# Patient Record
Sex: Female | Born: 1995
Health system: Southern US, Community
[De-identification: ages and names within clinical notes are randomized; demographics above are authoritative.]

## PROBLEM LIST (undated history)

## (undated) DIAGNOSIS — G43909 Migraine, unspecified, not intractable, without status migrainosus: Secondary | ICD-10-CM

## (undated) DIAGNOSIS — G40909 Epilepsy, unspecified, not intractable, without status epilepticus: Secondary | ICD-10-CM

## (undated) HISTORY — PX: WISDOM TOOTH EXTRACTION: SHX21

## (undated) HISTORY — PX: TONSILLECTOMY: SUR1361

---
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
## 2014-11-25 DIAGNOSIS — R55 Syncope and collapse: Secondary | ICD-10-CM | POA: Insufficient documentation

## 2015-02-22 NOTE — H&P (Signed)
PATIENT NAME:  Taylor Esparza, Taylor Esparza MR#:  [id number] DATE OF BIRTH:  05-22-1996  DATE OF ADMISSION:  06/05/2012  REFERRING PHYSICIAN: [facility], Liane Comber, NP PRIMARY CARE PHYSICIAN: Dr. Sullivan Lone   CHIEF COMPLAINT: Sore throat, fever, orthostasis, syncope x2.   HISTORY OF PRESENT ILLNESS: Patient is a 19 year old female with past medical history of migraine headaches, seasonal allergies and vasovagal syncope, patient's father also vasovagal syncope who started feeling faint and not well about five days ago. Subsequently she developed sore throat and fatigue. Today she went to see her PCP because of worsening sore throat, headache, body aches and feeling that she was going to pass out. In the PCPs office she was tested for strep and was found to be positive. She was orthostatic with drop in blood pressure and tachycardia just on sitting up from a laying down position. She actually passed out twice when they attempted to check orthostatics on her in the PCPs office therefore admission was requested.   ALLERGIES: No known allergies.   MEDICATIONS: None.   PAST MEDICAL HISTORY: 1. Seasonal allergies.  2. Vasovagal syncope. 3. Migraine headaches.   PAST SURGICAL HISTORY:  1. Tonsillectomy as a child. 2. Bead got stuck in the patient's nose which had to be surgically removed.    FAMILY HISTORY: Father has vasovagal syncope. Mother has migraine headaches, Meniere's disease.   SOCIAL HISTORY: There is no history of smoking, alcohol or drug abuse. Patient lives with her parents. She is scheduled to start school/college shortly.    REVIEW OF SYSTEMS: CONSTITUTIONAL: Reports fever, fatigue, weakness. EYES: Denies any blurred or double vision. ENT: Denies any tinnitus, ear pain. Reports sore throat. RESPIRATORY: Denies any wheezing, shortness of breath. CARDIOVASCULAR: Reports palpitations and syncope. GASTROINTESTINAL: Denies any nausea, vomiting, diarrhea, abdominal pain.  GENITOURINARY: Denies any dysuria, hematuria. ENDO: Denies any polyuria or nocturia. HEME/LYMPH: Denies any anemia, easy bruisability. MUSCULOSKELETAL: Denies any swelling, gout. NEUROLOGICAL: Denies any numbness, weakness. PSYCH: Denies any anxiety, depression. INTEGUMENT: Denies acne or rash.   PHYSICAL EXAMINATION:  VITAL SIGNS: Temperature 98.7, heart rate 82, respiratory rate 16, blood pressure 100/67, pulse oximetry 97% on room air.   GENERAL: Patient is a young Caucasian female lying comfortably in bed, not in acute distress.   HEAD: Atraumatic, normocephalic.   EYES: No pallor, icterus, or cyanosis. Pupils are equal, round and reactive to light and accommodation. Extraocular movements intact.   ENT: Wet mucous membranes. No oropharyngeal or thrush. Patient is status post tonsillectomy and there is erythema in her oropharynx.   NECK: Supple. No masses. No JVD. No thyromegaly.   CHEST WALL: No tenderness to palpation. Not using accessory muscles of respiration. No intracostal muscle retractions.   LUNGS: Bilaterally clear to auscultation. No wheezing, rales, rhonchi regular.   CARDIOVASCULAR: S1, S2 regular. No murmur, rubs, or gallops.   ABDOMEN: Soft, nontender, nondistended. No guarding. No rigidity. No organomegaly. Normal bowel sounds.   SKIN: No rashes or lesions.   PERIPHERIES: No pedal edema. 2+ pedal pulses.   MUSCULOSKELETAL: No cyanosis or clubbing.   NEUROLOGIC: Awake, alert, oriented x3. Nonfocal neurological exam. Cranial nerves grossly intact.   PSYCH: Normal mood and affect.   LABORATORY, DIAGNOSTIC, AND RADIOLOGICAL DATA: Labs have been ordered and are currently pending.   ASSESSMENT AND PLAN: 19 year old female with history of vasovagal syncope, migraines, seasonal allergies sent from PCPs office as a direct admit for fevers, strep throat, orthostasis and syncope x2. 1. Strep throat with fever. Will admit the patient has  observation. Start her on IV fluids,  IV antibiotics. Will obtain blood cultures since patient has had a fever of 101.3 at home. Will also check a chest x-ray, urinalysis, and pregnancy test. Will obtain CBC, complete metabolic panel. Will place on mechanical soft diet since patient reports sore throat and also Cepacol lozenges.   2. Orthostasis, possibly because of dehydration and poor oral intake resulting from strep throat/sore throat. Will hydrate with IV fluids. Monitor orthostasis.  3. Syncope x2 in the PCPs office. Patient reports that every time she sits up she feels faint. The syncope could be related to orthostasis. Patient also has history of vasovagal syncope. Will monitor patient closely.  Discussed with patient's primary care physician, discussed with the patient and her parents the plan of care and management.   TIME SPENT: 75 minutes.   ____________________________ Darrick MeigsSangeeta Mason Burleigh, MD sp:cms D: 06/05/2012 13:39:28 ET T: 06/05/2012 14:04:04 ET JOB#: [id number]  cc: Darrick MeigsSangeeta Jams Trickett, MD, <Dictator> Richard L. Sullivan LoneGilbert, MD Darrick MeigsSANGEETA Maelys Kinnick MD ELECTRONICALLY SIGNED 06/05/2012 16:19

## 2015-02-22 NOTE — Discharge Summary (Signed)
PATIENT NAME:  Taylor Esparza, Taylor Esparza MR#:  [id number] DATE OF BIRTH:  05-21-1996  DATE OF ADMISSION:  06/05/2012 DATE OF DISCHARGE:  06/07/2012  ADMISSION DIAGNOSES:  1. Strep pharyngitis.  2. Syncope.   DISCHARGE DIAGNOSES:  1. Syncope likely due to infection or vasovagal.  2. Cervical lymphadenopathy with positive strep throat culture and pharyngitis.   CONSULTS: None.   LABORATORY, DIAGNOSTIC AND RADIOLOGICAL DATA: White blood cells 15, hemoglobin 12, hematocrit 35, platelets 235. Blood cultures negative to date.   Mono test is negative.   2-D echocardiogram showed normal ejection fraction, no valvular abnormalities.   HOSPITAL COURSE: 19 year old female was sent from PCPs office with syncope and strep pharyngitis. For further details, please refer to the history and physical. 1. Vasovagal syncope likely due to infection. Echo was normal. No evidence of HOCM.Marland Kitchen. Patient may need a Holter and outpatient follow up ad also to rule out adrenal insufficiency given her history of syncopal episodes in the past.  2. Cervical lymphadenopathy with positive strep throat culture and pharyngitis. Patient was on IV Unasyn. Will change to p.o. amoxicillin at discharge. She has history of multiple sinus and respiratory issues. Her PCP may consider an immunologist follow up.   DISCHARGE MEDICATIONS:  1. Famciclovir 500 mg every 12 hours for herpes type I.  2. Amoxicillin 875 b.i.d. for eight days.   DISCHARGE DIET: Regular.   DISCHARGE ACTIVITY: As tolerated.   DISCHARGE FOLLOW UP: Follow up with Liane Combereborah Brady, NP in one week.    TIME SPENT: 35 minutes.  ____________________________ Janyth ContesSital P. Juliene PinaMody, MD spm:cms D: 06/07/2012 14:38:58 ET Esparza: 06/07/2012 16:23:13 ET JOB#: [id number]  cc: Yalexa Blust P. Juliene PinaMody, MD, <Dictator> Alphonzo Severanceeborah D. Brady, NP Janyth ContesSITAL P Dreden Rivere MD ELECTRONICALLY SIGNED 06/08/2012 22:05

## 2015-12-22 LAB — HM PAP SMEAR: HM Pap smear: NEGATIVE

## 2016-02-21 ENCOUNTER — Encounter: Payer: Self-pay | Admitting: Family Medicine

## 2016-03-09 DIAGNOSIS — I4510 Unspecified right bundle-branch block: Secondary | ICD-10-CM | POA: Diagnosis not present

## 2016-03-09 DIAGNOSIS — Z79899 Other long term (current) drug therapy: Secondary | ICD-10-CM | POA: Diagnosis not present

## 2016-03-09 DIAGNOSIS — I456 Pre-excitation syndrome: Secondary | ICD-10-CM | POA: Diagnosis not present

## 2016-03-09 DIAGNOSIS — Z87891 Personal history of nicotine dependence: Secondary | ICD-10-CM | POA: Diagnosis not present

## 2016-03-09 DIAGNOSIS — I442 Atrioventricular block, complete: Secondary | ICD-10-CM | POA: Diagnosis not present

## 2016-03-09 DIAGNOSIS — R569 Unspecified convulsions: Secondary | ICD-10-CM | POA: Diagnosis not present

## 2016-03-09 DIAGNOSIS — Z5181 Encounter for therapeutic drug level monitoring: Secondary | ICD-10-CM | POA: Diagnosis not present

## 2016-03-09 DIAGNOSIS — R55 Syncope and collapse: Secondary | ICD-10-CM | POA: Diagnosis not present

## 2016-05-17 DIAGNOSIS — R55 Syncope and collapse: Secondary | ICD-10-CM | POA: Diagnosis not present

## 2016-05-17 DIAGNOSIS — R569 Unspecified convulsions: Secondary | ICD-10-CM | POA: Diagnosis not present

## 2016-05-21 DIAGNOSIS — F4323 Adjustment disorder with mixed anxiety and depressed mood: Secondary | ICD-10-CM | POA: Diagnosis not present

## 2016-05-28 DIAGNOSIS — F4323 Adjustment disorder with mixed anxiety and depressed mood: Secondary | ICD-10-CM | POA: Diagnosis not present

## 2016-05-28 DIAGNOSIS — B360 Pityriasis versicolor: Secondary | ICD-10-CM | POA: Diagnosis not present

## 2016-05-28 DIAGNOSIS — L819 Disorder of pigmentation, unspecified: Secondary | ICD-10-CM | POA: Diagnosis not present

## 2016-06-04 DIAGNOSIS — F4323 Adjustment disorder with mixed anxiety and depressed mood: Secondary | ICD-10-CM | POA: Diagnosis not present

## 2016-06-12 DIAGNOSIS — F4323 Adjustment disorder with mixed anxiety and depressed mood: Secondary | ICD-10-CM | POA: Diagnosis not present

## 2016-06-27 DIAGNOSIS — F4323 Adjustment disorder with mixed anxiety and depressed mood: Secondary | ICD-10-CM | POA: Diagnosis not present

## 2016-07-02 DIAGNOSIS — F4323 Adjustment disorder with mixed anxiety and depressed mood: Secondary | ICD-10-CM | POA: Diagnosis not present

## 2016-07-09 DIAGNOSIS — F4323 Adjustment disorder with mixed anxiety and depressed mood: Secondary | ICD-10-CM | POA: Diagnosis not present

## 2016-07-16 DIAGNOSIS — F4323 Adjustment disorder with mixed anxiety and depressed mood: Secondary | ICD-10-CM | POA: Diagnosis not present

## 2016-07-23 DIAGNOSIS — F4323 Adjustment disorder with mixed anxiety and depressed mood: Secondary | ICD-10-CM | POA: Diagnosis not present

## 2016-08-19 DIAGNOSIS — S93601A Unspecified sprain of right foot, initial encounter: Secondary | ICD-10-CM | POA: Diagnosis not present

## 2016-08-19 DIAGNOSIS — X58XXXA Exposure to other specified factors, initial encounter: Secondary | ICD-10-CM | POA: Diagnosis not present

## 2016-08-19 DIAGNOSIS — S99921A Unspecified injury of right foot, initial encounter: Secondary | ICD-10-CM | POA: Diagnosis not present

## 2016-08-19 DIAGNOSIS — M7989 Other specified soft tissue disorders: Secondary | ICD-10-CM | POA: Diagnosis not present

## 2016-08-19 DIAGNOSIS — S20412A Abrasion of left back wall of thorax, initial encounter: Secondary | ICD-10-CM | POA: Diagnosis not present

## 2016-08-28 ENCOUNTER — Ambulatory Visit: Payer: Self-pay | Admitting: Family Medicine

## 2016-10-01 DIAGNOSIS — B9789 Other viral agents as the cause of diseases classified elsewhere: Secondary | ICD-10-CM | POA: Diagnosis not present

## 2016-10-01 DIAGNOSIS — H9209 Otalgia, unspecified ear: Secondary | ICD-10-CM | POA: Diagnosis not present

## 2016-10-01 DIAGNOSIS — J069 Acute upper respiratory infection, unspecified: Secondary | ICD-10-CM | POA: Diagnosis not present

## 2016-10-18 DIAGNOSIS — Z01419 Encounter for gynecological examination (general) (routine) without abnormal findings: Secondary | ICD-10-CM | POA: Diagnosis not present

## 2016-10-18 DIAGNOSIS — Z113 Encounter for screening for infections with a predominantly sexual mode of transmission: Secondary | ICD-10-CM | POA: Diagnosis not present

## 2016-10-18 DIAGNOSIS — L282 Other prurigo: Secondary | ICD-10-CM | POA: Diagnosis not present

## 2016-10-18 DIAGNOSIS — Z6821 Body mass index (BMI) 21.0-21.9, adult: Secondary | ICD-10-CM | POA: Diagnosis not present

## 2016-11-20 ENCOUNTER — Encounter: Payer: Self-pay | Admitting: Family Medicine

## 2016-11-20 ENCOUNTER — Ambulatory Visit (INDEPENDENT_AMBULATORY_CARE_PROVIDER_SITE_OTHER): Payer: BLUE CROSS/BLUE SHIELD | Admitting: Family Medicine

## 2016-11-20 VITALS — BP 124/76 | HR 100 | Temp 98.7°F | Resp 12 | Ht 66.25 in | Wt 141.0 lb

## 2016-11-20 DIAGNOSIS — F329 Major depressive disorder, single episode, unspecified: Secondary | ICD-10-CM | POA: Insufficient documentation

## 2016-11-20 DIAGNOSIS — F411 Generalized anxiety disorder: Secondary | ICD-10-CM | POA: Diagnosis not present

## 2016-11-20 DIAGNOSIS — R6889 Other general symptoms and signs: Secondary | ICD-10-CM

## 2016-11-20 DIAGNOSIS — F3289 Other specified depressive episodes: Secondary | ICD-10-CM | POA: Diagnosis not present

## 2016-11-20 DIAGNOSIS — F32A Depression, unspecified: Secondary | ICD-10-CM | POA: Insufficient documentation

## 2016-11-20 MED ORDER — CITALOPRAM HYDROBROMIDE 10 MG PO TABS: 10.0000 mg | ORAL_TABLET | Freq: Every day | ORAL | 5 refills | Status: DC

## 2016-11-20 MED ORDER — OSELTAMIVIR PHOSPHATE 75 MG PO CAPS: 75.0000 mg | ORAL_CAPSULE | Freq: Two times a day (BID) | ORAL | 0 refills | Status: DC

## 2016-11-20 NOTE — Progress Notes (Signed)
Taylor Esparza  MRN: [id number] DOB: [birth year]  Subjective:  HPI  Patient is here to discuss 2 issues.  First, she has felt bad since Sunday 11/18/16. Symptoms are sore throat, ear pain, nasal/head congestion, headache, swollen glands, body aches, feeling feverish. Her mother had flu and father got sick and they took Tamiflu. They had some at home and patient has been taking Tamiflu since yesterday.   Second is anxiety and depression has gotten worse. This has been an issue for a few months but worse in the last 2 to 3 weeks. She use to be on Sertraline in the past for short period of time and that was started by gynecologist she thinks and then she saw psychiatrist one time and was switched to Effexor and tried that medication for about 6 months to 1 year. She then stopped just did not feel balanced-felt flat. She has been seen therapist/counselor and has appointment with new psychiatrist on Friday 11/23/16. She admits to having occasional suicidal thoughts "like just don't want to be here" per patient. Has not attempted to hurt yourself and does not have a plan of hurting or killing herself. Overall she has had trouble with depression/anxiety since 2012 around that. Alcohol use about 1 to 3 beers at dinner daily. Denies drug use or tobacco use.  She does states that she broke up with her boyfriend in august 2017 officially after 2 years of been together.  Depression screen PHQ 2/9 11/20/2016  Decreased Interest 1  Down, Depressed, Hopeless 2  PHQ - 2 Score 3  Altered sleeping 1  Tired, decreased energy 1  Change in appetite 1  Feeling bad or failure about yourself  2  Trouble concentrating 0  Moving slowly or fidgety/restless 0  Suicidal thoughts 1  PHQ-9 Score 9  Difficult doing work/chores Somewhat difficult   Patient Active Problem List   Diagnosis Date Noted  . Anxiety, generalized 11/20/2016  . Clinical depression 11/20/2016  . Vasovagal syncope 11/25/2014    History  reviewed. No pertinent past medical history.  Social History   Social History  . Marital status: Single    Spouse name: N/A  . Number of children: N/A  . Years of education: N/A   Occupational History  . Not on file.   Social History Main Topics  . Smoking status: Never Smoker  . Smokeless tobacco: Never Used  . Alcohol use Yes     Comment: 1 beer at dinner usually  . Drug use: No  . Sexual activity: Not on file   Other Topics Concern  . Not on file   Social History Narrative  . No narrative on file    Outpatient Encounter Prescriptions as of 11/20/2016  Medication Sig Note  . SPRINTEC 28 0.25-35 MG-MCG tablet Take 1 tablet by mouth daily. 11/20/2016: Received from: External Pharmacy Received Sig: TAKE 1 TABLET EVERY DAY  . [DISCONTINUED] venlafaxine XR (EFFEXOR XR) 75 MG 24 hr capsule Take 1 capsule by mouth. 11/20/2016: Prescribed by Dr. Maryruth Bun (psychiatry) Received from: [facility] Received Sig: EFFEXOR XR, 75MG  (Oral Capsule Extended Release 24 Hour) - Historical Medication  1 daily (75 MG) Active Comments: Prescribed by Dr. Maryruth Bun (psychiatry   No facility-administered encounter medications on file as of 11/20/2016.     No Known Allergies  Review of Systems  Constitutional: Positive for chills, fever and malaise/fatigue.  HENT: Positive for congestion, ear pain and sore throat.   Respiratory: Negative.   Cardiovascular: Negative.   Musculoskeletal:  Negative.   Psychiatric/Behavioral: Positive for depression. The patient has insomnia.        Occasional suicidal thoughts.    Objective:  BP 124/76   Pulse 100   Temp 98.7 F (37.1 C)   Resp 12   Ht 5' 6.25" (1.683 m)   Wt 141 lb (64 kg)   LMP 10/27/2016   SpO2 98%   BMI 22.59 kg/m   Physical Exam  Constitutional: She is oriented to person, place, and time and well-developed, well-nourished, and in no distress.  HENT:  Head: Normocephalic and atraumatic.  Right Ear: External ear normal.    Left Ear: External ear normal.  Mouth/Throat: Oropharynx is clear and moist.  Eyes: Conjunctivae are normal. Pupils are equal, round, and reactive to light.  Neck: Normal range of motion. Neck supple.  Cardiovascular: Normal rate, regular rhythm, normal heart sounds and intact distal pulses.   No murmur heard. Pulmonary/Chest: Effort normal and breath sounds normal. No respiratory distress. She has no wheezes.  Neurological: She is alert and oriented to person, place, and time.    Assessment and Plan :  1. Anxiety, generalized 2. Other depression Worsening. Advised patient to keep appointment with psychiatrist. Continue counseling. Start Citalopram. Follow up with psychiatrist on this issue unless psychiatrist says to follow up with us instead.I do not think she is bipolar. 3. Flu-like symptoms Both parents had flu. I think she has flu. She has been on Tamiflu since yesterday 11/19/16, will extend treatment with Tamiflu at twice daily.  HPI, Exam and A&P transcribed under direction and in the presence of Julieanne Mansonichard Tyray Proch, MD. I have done the exam and reviewed the chart and it is accurate to the best of my knowledge. [employer]  technology has been used and  any errors in dictation or transcription are unintentional. Julieanne Mansonichard Sharone Almond M.D. [facility]

## 2016-11-21 LAB — TSH: TSH: 1.92 u[IU]/mL (ref 0.450–4.500)

## 2016-11-23 DIAGNOSIS — F411 Generalized anxiety disorder: Secondary | ICD-10-CM | POA: Diagnosis not present

## 2016-11-23 DIAGNOSIS — F331 Major depressive disorder, recurrent, moderate: Secondary | ICD-10-CM | POA: Diagnosis not present

## 2016-11-23 DIAGNOSIS — F603 Borderline personality disorder: Secondary | ICD-10-CM | POA: Diagnosis not present

## 2016-12-07 DIAGNOSIS — F331 Major depressive disorder, recurrent, moderate: Secondary | ICD-10-CM | POA: Diagnosis not present

## 2016-12-28 DIAGNOSIS — F331 Major depressive disorder, recurrent, moderate: Secondary | ICD-10-CM | POA: Diagnosis not present

## 2017-01-18 DIAGNOSIS — F331 Major depressive disorder, recurrent, moderate: Secondary | ICD-10-CM | POA: Diagnosis not present

## 2017-02-04 ENCOUNTER — Other Ambulatory Visit: Payer: Self-pay

## 2017-02-04 ENCOUNTER — Telehealth: Payer: Self-pay | Admitting: Family Medicine

## 2017-02-04 MED ORDER — CITALOPRAM HYDROBROMIDE 10 MG PO TABS: 10.0000 mg | ORAL_TABLET | Freq: Every day | ORAL | 1 refills | Status: DC

## 2017-02-04 NOTE — Telephone Encounter (Signed)
Rx sent to pharmacy  ED 

## 2017-02-04 NOTE — Telephone Encounter (Signed)
[facility] faxed a refill request for a 90-day supply of the following medication. Thanks   Citalopram HBR  Take 1 tablet (10 mg Total) by mouth Daily.

## 2017-02-06 DIAGNOSIS — F4323 Adjustment disorder with mixed anxiety and depressed mood: Secondary | ICD-10-CM | POA: Diagnosis not present

## 2017-02-13 DIAGNOSIS — F4323 Adjustment disorder with mixed anxiety and depressed mood: Secondary | ICD-10-CM | POA: Diagnosis not present

## 2017-03-05 DIAGNOSIS — F4323 Adjustment disorder with mixed anxiety and depressed mood: Secondary | ICD-10-CM | POA: Diagnosis not present

## 2017-03-12 DIAGNOSIS — F4323 Adjustment disorder with mixed anxiety and depressed mood: Secondary | ICD-10-CM | POA: Diagnosis not present

## 2017-03-26 DIAGNOSIS — F4323 Adjustment disorder with mixed anxiety and depressed mood: Secondary | ICD-10-CM | POA: Diagnosis not present

## 2017-04-02 DIAGNOSIS — F4323 Adjustment disorder with mixed anxiety and depressed mood: Secondary | ICD-10-CM | POA: Diagnosis not present

## 2017-04-23 DIAGNOSIS — F4323 Adjustment disorder with mixed anxiety and depressed mood: Secondary | ICD-10-CM | POA: Diagnosis not present

## 2017-04-30 DIAGNOSIS — F4323 Adjustment disorder with mixed anxiety and depressed mood: Secondary | ICD-10-CM | POA: Diagnosis not present

## 2017-05-21 DIAGNOSIS — F4323 Adjustment disorder with mixed anxiety and depressed mood: Secondary | ICD-10-CM | POA: Diagnosis not present

## 2017-06-04 DIAGNOSIS — F4323 Adjustment disorder with mixed anxiety and depressed mood: Secondary | ICD-10-CM | POA: Diagnosis not present

## 2017-06-11 DIAGNOSIS — F4323 Adjustment disorder with mixed anxiety and depressed mood: Secondary | ICD-10-CM | POA: Diagnosis not present

## 2017-06-25 DIAGNOSIS — F4323 Adjustment disorder with mixed anxiety and depressed mood: Secondary | ICD-10-CM | POA: Diagnosis not present

## 2017-07-23 DIAGNOSIS — F4323 Adjustment disorder with mixed anxiety and depressed mood: Secondary | ICD-10-CM | POA: Diagnosis not present

## 2017-07-30 DIAGNOSIS — F4323 Adjustment disorder with mixed anxiety and depressed mood: Secondary | ICD-10-CM | POA: Diagnosis not present

## 2017-08-06 DIAGNOSIS — F4323 Adjustment disorder with mixed anxiety and depressed mood: Secondary | ICD-10-CM | POA: Diagnosis not present

## 2017-08-13 DIAGNOSIS — F4323 Adjustment disorder with mixed anxiety and depressed mood: Secondary | ICD-10-CM | POA: Diagnosis not present

## 2017-09-03 DIAGNOSIS — F4323 Adjustment disorder with mixed anxiety and depressed mood: Secondary | ICD-10-CM | POA: Diagnosis not present

## 2017-09-17 DIAGNOSIS — F4323 Adjustment disorder with mixed anxiety and depressed mood: Secondary | ICD-10-CM | POA: Diagnosis not present

## 2017-09-28 DIAGNOSIS — J209 Acute bronchitis, unspecified: Secondary | ICD-10-CM | POA: Diagnosis not present

## 2018-02-10 DIAGNOSIS — J111 Influenza due to unidentified influenza virus with other respiratory manifestations: Secondary | ICD-10-CM | POA: Diagnosis not present

## 2018-03-27 DIAGNOSIS — H52221 Regular astigmatism, right eye: Secondary | ICD-10-CM | POA: Diagnosis not present

## 2018-04-03 ENCOUNTER — Ambulatory Visit: Payer: BLUE CROSS/BLUE SHIELD | Admitting: Family Medicine

## 2018-04-09 ENCOUNTER — Ambulatory Visit (INDEPENDENT_AMBULATORY_CARE_PROVIDER_SITE_OTHER): Payer: BLUE CROSS/BLUE SHIELD | Admitting: Family Medicine

## 2018-04-09 ENCOUNTER — Encounter: Payer: Self-pay | Admitting: Family Medicine

## 2018-04-09 VITALS — BP 124/78 | HR 64 | Temp 98.6°F | Resp 16 | Wt 150.0 lb

## 2018-04-09 DIAGNOSIS — F411 Generalized anxiety disorder: Secondary | ICD-10-CM | POA: Diagnosis not present

## 2018-04-09 DIAGNOSIS — G43809 Other migraine, not intractable, without status migrainosus: Secondary | ICD-10-CM

## 2018-04-09 MED ORDER — PROPRANOLOL HCL 20 MG PO TABS: 20.0000 mg | ORAL_TABLET | ORAL | 11 refills | Status: DC

## 2018-04-09 MED ORDER — SUMATRIPTAN SUCCINATE 50 MG PO TABS: 50.0000 mg | ORAL_TABLET | ORAL | 0 refills | Status: DC | PRN

## 2018-04-09 MED ORDER — PAROXETINE HCL 10 MG PO TABS: 10.0000 mg | ORAL_TABLET | Freq: Every day | ORAL | 11 refills | Status: DC

## 2018-04-09 NOTE — Progress Notes (Signed)
       Patient: Taylor ButteryChandler T Axton Female    DOB: 1996/07/19   22 y.o.   MRN: [id number] Visit Date: 04/09/2018  Today's Provider: Megan Mansichard Latasha Puskas Jr, MD   Chief Complaint  Patient presents with  . Headache  . Anxiety   Subjective:    HPI PT is here today to discuss her headaches and anxiety. She has been on migraine medication in the past and anxiety medication. She would like to know if she needs to see a specialist for this. She reports that she just stared a new job and she has already been out 2 and half days due to migraines.   She did try topamax and Imitrex but only remembers side effects of topamax.  No Known Allergies   Current Outpatient Medications:  .  citalopram (CELEXA) 10 MG tablet, Take 1 tablet (10 mg total) by mouth daily. (Patient not taking: Reported on 04/09/2018), Disp: 90 tablet, Rfl: 1 .  SPRINTEC 28 0.25-35 MG-MCG tablet, Take 1 tablet by mouth daily., Disp: , Rfl: 3  Review of Systems  Constitutional: Negative.   HENT: Negative.   Eyes: Negative.   Respiratory: Negative.   Cardiovascular: Negative.   Gastrointestinal: Negative.   Endocrine: Negative.   Musculoskeletal: Negative.   Skin: Negative.   Allergic/Immunologic: Negative.   Neurological: Positive for headaches.  Hematological: Negative.   Psychiatric/Behavioral: The patient is nervous/anxious.     Social History   Tobacco Use  . Smoking status: Never Smoker  . Smokeless tobacco: Never Used  Substance Use Topics  . Alcohol use: Yes    Comment: 1 beer at dinner usually   Objective:   BP 124/78 (BP Location: Left Arm, Patient Position: Sitting, Cuff Size: Normal)   Pulse 64   Temp 98.6 F (37 C) (Oral)   Resp 16   Wt 150 lb (68 kg)   BMI 24.03 kg/m  Vitals:   04/09/18 1626  BP: 124/78  Pulse: 64  Resp: 16  Temp: 98.6 F (37 C)  TempSrc: Oral  Weight: 150 lb (68 kg)     Physical Exam  Constitutional: She is oriented to person, place, and time. She appears  well-developed and well-nourished.  Eyes: Pupils are equal, round, and reactive to light. No scleral icterus.  Cardiovascular: Normal rate, regular rhythm and normal heart sounds.  Pulmonary/Chest: Effort normal and breath sounds normal.  Abdominal: Soft.  Neurological: She is alert and oriented to person, place, and time. She has normal strength.  Skin: Skin is warm and dry.  Psychiatric: She has a normal mood and affect. Her behavior is normal.        Assessment & Plan:     1. Anxiety, generalized  - propranolol (INDERAL) 20 MG tablet; Take 1 tablet (20 mg total) by mouth every morning.  Dispense: 30 tablet; Refill: 11 - SUMAtriptan (IMITREX) 50 MG tablet; Take 1 tablet (50 mg total) by mouth every 2 (two) hours as needed for migraine. May repeat in 2 hours if headache persists or recurs.  Dispense: 10 tablet; Refill: 0  2. Other migraine without status migrainosus, not intractable RTC 1 month. - PARoxetine (PAXIL) 10 MG tablet; Take 1 tablet (10 mg total) by mouth at bedtime.  Dispense: 30 tablet; Refill: 1       Nettie Wyffels Wendelyn BreslowGilbert Jr, MD  [facility]

## 2018-05-14 ENCOUNTER — Ambulatory Visit (INDEPENDENT_AMBULATORY_CARE_PROVIDER_SITE_OTHER): Payer: BLUE CROSS/BLUE SHIELD | Admitting: Family Medicine

## 2018-05-14 ENCOUNTER — Encounter: Payer: Self-pay | Admitting: Family Medicine

## 2018-05-14 VITALS — BP 122/70 | HR 90 | Temp 99.1°F | Ht 66.0 in | Wt 151.0 lb

## 2018-05-14 DIAGNOSIS — R5383 Other fatigue: Secondary | ICD-10-CM | POA: Diagnosis not present

## 2018-05-14 DIAGNOSIS — F411 Generalized anxiety disorder: Secondary | ICD-10-CM

## 2018-05-14 DIAGNOSIS — G43809 Other migraine, not intractable, without status migrainosus: Secondary | ICD-10-CM

## 2018-05-14 MED ORDER — DESVENLAFAXINE ER 50 MG PO TB24: 50.0000 mg | ORAL_TABLET | Freq: Every day | ORAL | 3 refills | Status: DC

## 2018-05-14 NOTE — Progress Notes (Signed)
Patient: Taylor ButteryChandler T Esparza Female    DOB: 1996-01-10   22 y.o.   MRN: [id number] Visit Date: 05/14/2018  Today's Provider: Megan Mansichard Larnell Granlund Jr, MD   Chief Complaint  Patient presents with  . Anxiety  . Migraine   Subjective:    HPI Patient comes in today for a follow up. She was seen in the office 1 month ago.     She reports that her migraines have improved. However, she does not feel the Paxil has helped. She reports that she has hot flashes and "waves" of nausea that come and go.     No Known Allergies   Current Outpatient Medications:  .  PARoxetine (PAXIL) 10 MG tablet, Take 1 tablet (10 mg total) by mouth at bedtime., Disp: 30 tablet, Rfl: 11 .  propranolol (INDERAL) 20 MG tablet, Take 1 tablet (20 mg total) by mouth every morning., Disp: 30 tablet, Rfl: 11 .  SUMAtriptan (IMITREX) 50 MG tablet, Take 1 tablet (50 mg total) by mouth every 2 (two) hours as needed for migraine. May repeat in 2 hours if headache persists or recurs., Disp: 10 tablet, Rfl: 0 .  citalopram (CELEXA) 10 MG tablet, Take 1 tablet (10 mg total) by mouth daily. (Patient not taking: Reported on 04/09/2018), Disp: 90 tablet, Rfl: 1 .  SPRINTEC 28 0.25-35 MG-MCG tablet, Take 1 tablet by mouth daily., Disp: , Rfl: 3  Review of Systems  Constitutional: Positive for fatigue. Negative for activity change, appetite change, chills, diaphoresis, fever and unexpected weight change.  Cardiovascular: Negative for chest pain and palpitations.  Gastrointestinal: Positive for nausea. Negative for abdominal distention, abdominal pain, diarrhea and vomiting.  Skin: Negative.   Neurological: Negative for dizziness, tremors, weakness, light-headedness and headaches.  Psychiatric/Behavioral: Positive for sleep disturbance. Negative for agitation, behavioral problems, confusion, decreased concentration, self-injury and suicidal ideas. The patient is nervous/anxious.     Social History   Tobacco Use  . Smoking  status: Never Smoker  . Smokeless tobacco: Never Used  Substance Use Topics  . Alcohol use: Yes    Comment: 1 beer at dinner usually   Objective:   BP 122/70 (BP Location: Right Arm, Patient Position: Sitting, Cuff Size: Normal)   Pulse 90   Temp 99.1 F (37.3 C)   Ht 5\' 6"  (1.676 m)   Wt 151 lb (68.5 kg)   LMP 04/19/2018   SpO2 96%   BMI 24.37 kg/m  Vitals:   05/14/18 1617  BP: 122/70  Pulse: 90  Temp: 99.1 F (37.3 C)  SpO2: 96%  Weight: 151 lb (68.5 kg)  Height: 5\' 6"  (1.676 m)     Physical Exam  Constitutional: She is oriented to person, place, and time. She appears well-developed and well-nourished.  HENT:  Head: Normocephalic and atraumatic.  Right Ear: External ear normal.  Left Ear: External ear normal.  Nose: Nose normal.  Eyes: Conjunctivae are normal. No scleral icterus.  Neck: No thyromegaly present.  Cardiovascular: Normal rate, regular rhythm and normal heart sounds.  Pulmonary/Chest: Effort normal and breath sounds normal.  Abdominal: Soft.  Musculoskeletal: She exhibits no edema.  Lymphadenopathy:    She has no cervical adenopathy.  Neurological: She is alert and oriented to person, place, and time.  Skin: Skin is warm and dry.  Psychiatric: She has a normal mood and affect. Her behavior is normal. Judgment and thought content normal.        Assessment & Plan:     1.  Anxiety, generalized RTC 1 month. - Desvenlafaxine ER (PRISTIQ) 50 MG TB24; Take 1 tablet (50 mg total) by mouth daily.  Dispense: 30 tablet; Refill: 3  2. Other migraine without status migrainosus, not intractable Improving.  3. Other fatigue  - CBC with Differential/Platelet - Comprehensive metabolic panel - TSH  I have done the exam and reviewed the chart and it is accurate to the best of my knowledge. Dentist has been used and  any errors in dictation or transcription are unintentional. Julieanne Manson M.D. [facility]       Megan Mans, MD  [facility]

## 2018-05-15 DIAGNOSIS — R5383 Other fatigue: Secondary | ICD-10-CM | POA: Diagnosis not present

## 2018-05-16 LAB — COMPREHENSIVE METABOLIC PANEL
ALT: 32 IU/L (ref 0–32)
AST: 38 IU/L (ref 0–40)
Albumin/Globulin Ratio: 1.9 (ref 1.2–2.2)
Albumin: 4.6 g/dL (ref 3.5–5.5)
Alkaline Phosphatase: 55 IU/L (ref 39–117)
BUN/Creatinine Ratio: 10 (ref 9–23)
BUN: 6 mg/dL (ref 6–20)
Bilirubin Total: 0.5 mg/dL (ref 0.0–1.2)
CALCIUM: 9.1 mg/dL (ref 8.7–10.2)
CHLORIDE: 103 mmol/L (ref 96–106)
CO2: 20 mmol/L (ref 20–29)
CREATININE: 0.58 mg/dL (ref 0.57–1.00)
GFR, EST AFRICAN AMERICAN: 149 mL/min/{1.73_m2} (ref >59)
GFR, EST NON AFRICAN AMERICAN: 129 mL/min/{1.73_m2} (ref >59)
GLUCOSE: 116 mg/dL — AB (ref 65–99)
Globulin, Total: 2.4 g/dL (ref 1.5–4.5)
Potassium: 4.2 mmol/L (ref 3.5–5.2)
Sodium: 138 mmol/L (ref 134–144)
TOTAL PROTEIN: 7 g/dL (ref 6.0–8.5)

## 2018-05-16 LAB — TSH: TSH: 2.23 u[IU]/mL (ref 0.450–4.500)

## 2018-05-16 LAB — CBC WITH DIFFERENTIAL/PLATELET
BASOS ABS: 0.1 10*3/uL (ref 0.0–0.2)
Basos: 1 %
EOS (ABSOLUTE): 0.5 10*3/uL — AB (ref 0.0–0.4)
Eos: 8 %
Hematocrit: 40 % (ref 34.0–46.6)
Hemoglobin: 13.2 g/dL (ref 11.1–15.9)
IMMATURE GRANS (ABS): 0 10*3/uL (ref 0.0–0.1)
IMMATURE GRANULOCYTES: 0 %
LYMPHS: 18 %
Lymphocytes Absolute: 1.1 10*3/uL (ref 0.7–3.1)
MCH: 32.2 pg (ref 26.6–33.0)
MCHC: 33 g/dL (ref 31.5–35.7)
MCV: 98 fL — ABNORMAL HIGH (ref 79–97)
MONOCYTES: 7 %
Monocytes Absolute: 0.5 10*3/uL (ref 0.1–0.9)
NEUTROS PCT: 66 %
Neutrophils Absolute: 4.1 10*3/uL (ref 1.4–7.0)
PLATELETS: 291 10*3/uL (ref 150–450)
RBC: 4.1 x10E6/uL (ref 3.77–5.28)
RDW: 13 % (ref 12.3–15.4)
WBC: 6.3 10*3/uL (ref 3.4–10.8)

## 2018-05-16 NOTE — Progress Notes (Signed)
Advised  ED 

## 2018-06-16 DIAGNOSIS — Z3201 Encounter for pregnancy test, result positive: Secondary | ICD-10-CM | POA: Diagnosis not present

## 2018-06-18 ENCOUNTER — Ambulatory Visit: Payer: Self-pay | Admitting: Family Medicine

## 2018-08-06 ENCOUNTER — Encounter: Payer: Self-pay | Admitting: Family Medicine

## 2018-09-26 DIAGNOSIS — R062 Wheezing: Secondary | ICD-10-CM | POA: Diagnosis not present

## 2018-09-26 DIAGNOSIS — J209 Acute bronchitis, unspecified: Secondary | ICD-10-CM | POA: Diagnosis not present

## 2018-09-30 DIAGNOSIS — J209 Acute bronchitis, unspecified: Secondary | ICD-10-CM | POA: Diagnosis not present

## 2018-09-30 DIAGNOSIS — L089 Local infection of the skin and subcutaneous tissue, unspecified: Secondary | ICD-10-CM | POA: Diagnosis not present

## 2018-09-30 DIAGNOSIS — W57XXXA Bitten or stung by nonvenomous insect and other nonvenomous arthropods, initial encounter: Secondary | ICD-10-CM | POA: Diagnosis not present

## 2018-09-30 DIAGNOSIS — S20462A Insect bite (nonvenomous) of left back wall of thorax, initial encounter: Secondary | ICD-10-CM | POA: Diagnosis not present

## 2018-10-24 DIAGNOSIS — S92301A Fracture of unspecified metatarsal bone(s), right foot, initial encounter for closed fracture: Secondary | ICD-10-CM | POA: Diagnosis not present

## 2018-10-24 DIAGNOSIS — M79671 Pain in right foot: Secondary | ICD-10-CM | POA: Diagnosis not present

## 2018-10-24 DIAGNOSIS — S9031XA Contusion of right foot, initial encounter: Secondary | ICD-10-CM | POA: Diagnosis not present

## 2018-11-18 DIAGNOSIS — S93601A Unspecified sprain of right foot, initial encounter: Secondary | ICD-10-CM | POA: Diagnosis not present

## 2018-11-18 DIAGNOSIS — M79671 Pain in right foot: Secondary | ICD-10-CM | POA: Diagnosis not present

## 2018-12-19 DIAGNOSIS — J069 Acute upper respiratory infection, unspecified: Secondary | ICD-10-CM | POA: Diagnosis not present

## 2018-12-19 DIAGNOSIS — R110 Nausea: Secondary | ICD-10-CM | POA: Diagnosis not present

## 2018-12-19 DIAGNOSIS — R6883 Chills (without fever): Secondary | ICD-10-CM | POA: Diagnosis not present

## 2019-01-15 DIAGNOSIS — F4323 Adjustment disorder with mixed anxiety and depressed mood: Secondary | ICD-10-CM | POA: Diagnosis not present

## 2019-01-29 DIAGNOSIS — F4323 Adjustment disorder with mixed anxiety and depressed mood: Secondary | ICD-10-CM | POA: Diagnosis not present

## 2019-02-12 DIAGNOSIS — F4323 Adjustment disorder with mixed anxiety and depressed mood: Secondary | ICD-10-CM | POA: Diagnosis not present

## 2019-03-03 DIAGNOSIS — F4323 Adjustment disorder with mixed anxiety and depressed mood: Secondary | ICD-10-CM | POA: Diagnosis not present

## 2019-03-17 DIAGNOSIS — F4323 Adjustment disorder with mixed anxiety and depressed mood: Secondary | ICD-10-CM | POA: Diagnosis not present

## 2019-03-31 DIAGNOSIS — F4323 Adjustment disorder with mixed anxiety and depressed mood: Secondary | ICD-10-CM | POA: Diagnosis not present

## 2019-04-17 DIAGNOSIS — F4323 Adjustment disorder with mixed anxiety and depressed mood: Secondary | ICD-10-CM | POA: Diagnosis not present

## 2019-05-08 DIAGNOSIS — F4323 Adjustment disorder with mixed anxiety and depressed mood: Secondary | ICD-10-CM | POA: Diagnosis not present

## 2019-05-21 DIAGNOSIS — F4323 Adjustment disorder with mixed anxiety and depressed mood: Secondary | ICD-10-CM | POA: Diagnosis not present

## 2019-06-04 DIAGNOSIS — F4323 Adjustment disorder with mixed anxiety and depressed mood: Secondary | ICD-10-CM | POA: Diagnosis not present

## 2019-06-25 DIAGNOSIS — F4323 Adjustment disorder with mixed anxiety and depressed mood: Secondary | ICD-10-CM | POA: Diagnosis not present

## 2019-07-15 ENCOUNTER — Emergency Department
Admission: EM | Admit: 2019-07-15 | Discharge: 2019-07-15 | Disposition: A | Discharge status: Home / Self Care | Payer: BC Managed Care – PPO | Attending: Emergency Medicine | Admitting: Emergency Medicine

## 2019-07-15 ENCOUNTER — Emergency Department: Payer: BC Managed Care – PPO

## 2019-07-15 ENCOUNTER — Other Ambulatory Visit: Payer: Self-pay

## 2019-07-15 DIAGNOSIS — Z79899 Other long term (current) drug therapy: Secondary | ICD-10-CM | POA: Diagnosis not present

## 2019-07-15 DIAGNOSIS — R569 Unspecified convulsions: Secondary | ICD-10-CM | POA: Diagnosis not present

## 2019-07-15 DIAGNOSIS — F17210 Nicotine dependence, cigarettes, uncomplicated: Secondary | ICD-10-CM | POA: Insufficient documentation

## 2019-07-15 LAB — CBC
HCT: 39.3 % (ref 36.0–46.0)
Hemoglobin: 13.4 g/dL (ref 12.0–15.0)
MCH: 33.2 pg (ref 26.0–34.0)
MCHC: 34.1 g/dL (ref 30.0–36.0)
MCV: 97.3 fL (ref 80.0–100.0)
Platelets: 230 10*3/uL (ref 150–400)
RBC: 4.04 MIL/uL (ref 3.87–5.11)
RDW: 11.9 % (ref 11.5–15.5)
WBC: 7.8 10*3/uL (ref 4.0–10.5)
nRBC: 0 % (ref 0.0–0.2)

## 2019-07-15 LAB — GLUCOSE, CAPILLARY: Glucose-Capillary: 108 mg/dL — ABNORMAL HIGH (ref 70–99)

## 2019-07-15 LAB — BASIC METABOLIC PANEL
Anion gap: 17 — ABNORMAL HIGH (ref 5–15)
BUN: <5 mg/dL — ABNORMAL LOW (ref 6–20)
CO2: 18 mmol/L — ABNORMAL LOW (ref 22–32)
Calcium: 9.6 mg/dL (ref 8.9–10.3)
Chloride: 100 mmol/L (ref 98–111)
Creatinine, Ser: 0.69 mg/dL (ref 0.44–1.00)
GFR calc Af Amer: >60 mL/min (ref >60)
GFR calc non Af Amer: >60 mL/min (ref >60)
Glucose, Bld: 106 mg/dL — ABNORMAL HIGH (ref 70–99)
Potassium: 3.7 mmol/L (ref 3.5–5.1)
Sodium: 135 mmol/L (ref 135–145)

## 2019-07-15 LAB — POCT PREGNANCY, URINE: Preg Test, Ur: NEGATIVE

## 2019-07-15 MED ORDER — LORAZEPAM 2 MG/ML IJ SOLN
1.0000 mg | Freq: Once | INTRAMUSCULAR | Status: AC
  Administered 2019-07-15: 19:00:00 1 mg via INTRAVENOUS
  Filled 2019-07-15: qty 1

## 2019-07-15 MED ORDER — ONDANSETRON HCL 4 MG/2ML IJ SOLN
INTRAMUSCULAR | Status: AC
  Filled 2019-07-15: qty 2

## 2019-07-15 MED ORDER — ONDANSETRON HCL 4 MG/2ML IJ SOLN
4.0000 mg | Freq: Once | INTRAMUSCULAR | Status: AC
  Administered 2019-07-15: 4 mg via INTRAVENOUS

## 2019-07-15 NOTE — ED Notes (Signed)
EMS IV removed.

## 2019-07-15 NOTE — ED Provider Notes (Signed)
[facility] Emergency Department Provider Note  ____________________________________________  Time seen: Approximately 4:40 PM  I have reviewed the triage vital signs and the nursing notes.   HISTORY  Chief Complaint Seizures    HPI Taylor Esparza is a 23 y.o. female with a past history of depression and migraines and recurrent vasovagal syncope  who is brought to the ED from work due to an observed seizure.  Patient was in her usual state of health, and was standing when her coworker noted the patient to become rigid and started to fall.  The coworker lowered her to the ground and reported to EMS that the patient had convulsions lasting about 30 seconds.  After that, the patient was confused for approximately 10 minutes by mother's estimation who is currently at bedside.  No significant fall or head trauma.  No recent illness fevers chills body aches.  She has been evaluated by neurology in the past for episodes of loss of consciousness which were deemed to most likely be vasovagal in nature.  She had an EEG in the past which was normal.  She has never had any neuroimaging.  Reviewed electronic medical record.  Last neurology note finds that no additional work-up is warranted at the time.  Patient has been on Topamax in the past but self discontinued due to intolerance of side effects.  Patient currently feels back to baseline except for fatigue    Past medical history Depression Migraine headache   Patient Active Problem List   Diagnosis Date Noted  . Anxiety, generalized 11/20/2016  . Clinical depression 11/20/2016  . Vasovagal syncope 11/25/2014     Past Surgical History:  Procedure Laterality Date  . TONSILLECTOMY    . WISDOM TOOTH EXTRACTION       Prior to Admission medications   Medication Sig Start Date End Date Taking? Authorizing Provider  citalopram (CELEXA) 10 MG tablet Take 1 tablet (10 mg total) by mouth daily. Patient not  taking: Reported on 04/09/2018 02/04/17   Maple Hudson., MD  Desvenlafaxine ER (PRISTIQ) 50 MG TB24 Take 1 tablet (50 mg total) by mouth daily. 05/14/18   Maple Hudson., MD  propranolol (INDERAL) 20 MG tablet Take 1 tablet (20 mg total) by mouth every morning. 04/09/18   Maple Hudson., MD  SPRINTEC 28 0.25-35 MG-MCG tablet Take 1 tablet by mouth daily. 11/02/16   [provider]  SUMAtriptan (IMITREX) 50 MG tablet Take 1 tablet (50 mg total) by mouth every 2 (two) hours as needed for migraine. May repeat in 2 hours if headache persists or recurs. 04/09/18   Maple Hudson., MD     Allergies Patient has no known allergies.   Family History  Problem Relation Age of Onset  . Endometriosis Mother   . Meniere's disease Mother   . Lung cancer Maternal Grandmother   . Bladder Cancer Maternal Grandfather   . Heart disease Maternal Grandfather   . Migraines Paternal Grandmother   . Hypertension Paternal Grandfather   . Diabetes Paternal Grandfather   . Hyperlipidemia Paternal Grandfather     Social History Social History   Tobacco Use  . Smoking status: Current Some Day Smoker  . Smokeless tobacco: Never Used  Substance Use Topics  . Alcohol use: Yes    Comment: 1 beer at dinner usually  . Drug use: No    Review of Systems  Constitutional:   No fever or chills.  ENT:   No  sore throat. No rhinorrhea. Cardiovascular:   No chest pain or syncope. Respiratory:   No dyspnea or cough. Gastrointestinal:   Negative for abdominal pain, vomiting and diarrhea.  Musculoskeletal:   Negative for focal pain or swelling All other systems reviewed and are negative except as documented above in ROS and HPI.  ____________________________________________   PHYSICAL EXAM:  VITAL SIGNS: ED Triage Vitals [07/15/19 1639]  Enc Vitals Group     BP (!) 141/84     Pulse Rate 95     Resp 15     Temp 98.8 F (37.1 C)     Temp Source Oral     SpO2 98 %      Weight 145 lb (65.8 kg)     Height 5\' 6"  (1.676 m)     Head Circumference      Peak Flow      Pain Score 0     Pain Loc      Pain Edu?      Excl. in GC?     Vital signs reviewed, nursing assessments reviewed.   Constitutional:   Alert and oriented. Non-toxic appearance. Eyes:   Conjunctivae are normal. EOMI. PERRL.  No nystagmus ENT      Head:   Normocephalic and atraumatic.         Neck:   No meningismus. Full ROM. Hematological/Lymphatic/Immunilogical:   No cervical lymphadenopathy. Cardiovascular:   RRR. Symmetric bilateral radial and DP pulses.  No murmurs. Cap refill less than 2 seconds. Respiratory:   Normal respiratory effort without tachypnea/retractions. Breath sounds are clear and equal bilaterally. No wheezes/rales/rhonchi. Gastrointestinal:   Soft and nontender. Non distended. There is no CVA tenderness.  No rebound, rigidity, or guarding. Genitourinary:   deferred Musculoskeletal:   Normal range of motion in all extremities. No joint effusions.  No lower extremity tenderness.  No edema. Neurologic:   Normal speech and language. Cranial nerves III through XII intact Motor grossly intact. No acute focal neurologic deficits are appreciated.  Skin:    Skin is warm, dry and intact. No rash noted.  No petechiae, purpura, or bullae.  ____________________________________________    LABS (pertinent positives/negatives) (all labs ordered are listed, but only abnormal results are displayed) Labs Reviewed  BASIC METABOLIC PANEL - Abnormal; Notable for the following components:      Result Value   CO2 18 (*)    Glucose, Bld 106 (*)    BUN <5 (*)    Anion gap 17 (*)    All other components within normal limits  GLUCOSE, CAPILLARY - Abnormal; Notable for the following components:   Glucose-Capillary 108 (*)    All other components within normal limits  CBC  CBG MONITORING, ED  POC URINE PREG, ED   ____________________________________________   EKG  Interpreted by  me  Date: 07/15/2019  Rate: 93  Rhythm: normal sinus rhythm  QRS Axis: normal  Intervals: normal  ST/T Wave abnormalities: normal  Conduction Disutrbances: none  Narrative Interpretation: unremarkable      ____________________________________________    RADIOLOGY  No results found.  ____________________________________________   PROCEDURES Procedures  ____________________________________________  DIFFERENTIAL DIAGNOSIS   Seizure, intracranial structural lesion, syncope  CLINICAL IMPRESSION / ASSESSMENT AND PLAN / ED COURSE  Medications ordered in the ED: Medications  LORazepam (ATIVAN) injection 1 mg (has no administration in time range)  ondansetron (ZOFRAN) 4 MG/2ML injection (  Not Given 07/15/19 1704)  ondansetron (ZOFRAN) injection 4 mg (4 mg Intravenous Given 07/15/19 1702)  Pertinent labs & imaging results that were available during my care of the patient were reviewed by me and considered in my medical decision making (see chart for details).  HANSIKA LEAMING was evaluated in Emergency Department on 07/15/2019 for the symptoms described in the history of present illness. She was evaluated in the context of the global COVID-19 pandemic, which necessitated consideration that the patient might be at risk for infection with the SARS-CoV-2 virus that causes COVID-19. Institutional protocols and algorithms that pertain to the evaluation of patients at risk for COVID-19 are in a state of rapid change based on information released by regulatory bodies including the CDC and federal and state organizations. These policies and algorithms were followed during the patient's care in the ED.     Clinical Course as of Jul 14 1849  Wed Jul 15, 2019  1653 Patient presents with a witnessed seizure, estimates of had convulsions for about 35 seconds in a postictal phase lasting 10 to 15 minutes.  Now back to baseline.  She has had neurology evaluation in the past and previous EEG  many years ago but never any neuroimaging.  I will obtain an MRI of her brain today in addition to basic labs.   [PS]    Clinical Course User Index [PS] Carrie Mew, MD     ----------------------------------------- 8:39 PM on 07/15/2019 -----------------------------------------  Patient remains at baseline in the ED.  Vital signs are normal.  MRI is normal.  Stable for discharge home.  Counseled on seizure precautions, hydration, good sleep and diet, avoid alcohol.  ____________________________________________   FINAL CLINICAL IMPRESSION(S) / ED DIAGNOSES    Final diagnoses:  Seizure [facility])     ED Discharge Orders    None      Portions of this note were generated with dragon dictation software. Dictation errors may occur despite best attempts at proofreading.   Carrie Mew, MD 07/15/19 2040

## 2019-07-15 NOTE — ED Notes (Signed)
Pt's mother at bedside.

## 2019-07-15 NOTE — ED Notes (Signed)
Pt providing urine sample. Will give ativan once back to bed. MRI coming to get pt in 69mins.

## 2019-07-15 NOTE — ED Notes (Signed)
Pt given this RN's ascom to be screened by MRI staff.

## 2019-07-15 NOTE — ED Notes (Signed)
Pt signed printed d/c paperwork.  

## 2019-07-15 NOTE — ED Triage Notes (Addendum)
Pt in via EMS from work d/t "seizure-like" episode lasting about 30 seconds witnessed by pt's friend who "has seizures." Pt states history of vasovagal episodes but denies seizures. States did not hit head today as her friend assisted her to the ground and also denies LOC. EMS states post ictal on arrival but became A&Ox4 very quickly. Pt A&Ox4 on arrival to ER. BG 123; 148/86; 125 HR with EMS; 20g L ac placed by EMS.

## 2019-07-15 NOTE — ED Notes (Signed)
Pt in bathroom changing clothes. Mother remains at bedside. State they have already been updated by EDP.

## 2019-07-15 NOTE — ED Notes (Addendum)
Grn/lav tubes sent to lab. Seizure pads on bedrails.

## 2019-07-15 NOTE — ED Notes (Signed)
EKG completed

## 2019-07-15 NOTE — ED Notes (Signed)
Called MRI to request that they let this RN know ahead of time when pt will go to MRI so this RN can give scheduled ativan as pt states she is claustrophobic/anxious during imaging.

## 2019-07-15 NOTE — Discharge Instructions (Addendum)
Your MRI of the brian was normal today. Please follow up with neurology for further evaluation of your symptoms.

## 2019-07-17 DIAGNOSIS — E538 Deficiency of other specified B group vitamins: Secondary | ICD-10-CM | POA: Diagnosis not present

## 2019-07-17 DIAGNOSIS — E559 Vitamin D deficiency, unspecified: Secondary | ICD-10-CM | POA: Diagnosis not present

## 2019-07-17 DIAGNOSIS — G43019 Migraine without aura, intractable, without status migrainosus: Secondary | ICD-10-CM | POA: Diagnosis not present

## 2019-07-17 DIAGNOSIS — R569 Unspecified convulsions: Secondary | ICD-10-CM | POA: Diagnosis not present

## 2019-07-20 DIAGNOSIS — R569 Unspecified convulsions: Secondary | ICD-10-CM | POA: Diagnosis not present

## 2019-07-20 DIAGNOSIS — Z79899 Other long term (current) drug therapy: Secondary | ICD-10-CM | POA: Diagnosis not present

## 2019-07-20 DIAGNOSIS — E559 Vitamin D deficiency, unspecified: Secondary | ICD-10-CM | POA: Diagnosis not present

## 2019-07-20 DIAGNOSIS — E538 Deficiency of other specified B group vitamins: Secondary | ICD-10-CM | POA: Diagnosis not present

## 2019-07-21 DIAGNOSIS — R569 Unspecified convulsions: Secondary | ICD-10-CM | POA: Diagnosis not present

## 2019-08-16 DIAGNOSIS — R569 Unspecified convulsions: Secondary | ICD-10-CM | POA: Diagnosis not present

## 2019-08-25 DIAGNOSIS — S92354A Nondisplaced fracture of fifth metatarsal bone, right foot, initial encounter for closed fracture: Secondary | ICD-10-CM | POA: Diagnosis not present

## 2019-09-09 DIAGNOSIS — S92354A Nondisplaced fracture of fifth metatarsal bone, right foot, initial encounter for closed fracture: Secondary | ICD-10-CM | POA: Diagnosis not present

## 2019-10-16 DIAGNOSIS — S92354A Nondisplaced fracture of fifth metatarsal bone, right foot, initial encounter for closed fracture: Secondary | ICD-10-CM | POA: Diagnosis not present

## 2020-06-13 ENCOUNTER — Telehealth: Payer: Self-pay

## 2020-06-13 ENCOUNTER — Ambulatory Visit (INDEPENDENT_AMBULATORY_CARE_PROVIDER_SITE_OTHER): Payer: BLUE CROSS/BLUE SHIELD | Admitting: Family Medicine

## 2020-06-13 DIAGNOSIS — N926 Irregular menstruation, unspecified: Secondary | ICD-10-CM

## 2020-06-13 DIAGNOSIS — M791 Myalgia, unspecified site: Secondary | ICD-10-CM

## 2020-06-13 DIAGNOSIS — R509 Fever, unspecified: Secondary | ICD-10-CM

## 2020-06-13 DIAGNOSIS — N3000 Acute cystitis without hematuria: Secondary | ICD-10-CM

## 2020-06-13 NOTE — Telephone Encounter (Signed)
Copied from CRM [id number]. Topic: General - Other >> Jun 13, 2020  4:12 PM Tamela Oddi wrote: Reason for CRM: Patient called to ask the nurse to call her regarding her notes from today's visit.  She needs something to show her employer.  CB# [phone]

## 2020-06-13 NOTE — Progress Notes (Signed)
Virtual telephone visit Virtual Visit via Telephone Note  I connected with Taylor Esparza on 06/13/20 at 10:00 AM EDT by telephone and verified that I am speaking with the correct person using two identifiers.  Location: Patient: home Provider: Office   I discussed the limitations, risks, security and privacy concerns of performing an evaluation and management service by telephone and the availability of in person appointments. I also discussed with the patient that there may be a patient responsible charge related to this service. The patient expressed understanding and agreed to proceed.   History of Present Illness: [facility] with low-grade fever body aches sore throat and diarrhea for 3 days.  I did a rapid Covid test and a rapid flu test which were negative.  PCR test was not obtained. Urinalysis grew out beta strep.  She has been treated with Augmentin for the past 4 days. She continues to run fever of about 100 this morning and have some myalgias and fatigue. She is also lost some weight according to the note from the urgent care center.  The patient is undergoing treatment for epilepsy with Keppra by neurology. Her menstrual cycles have been irregular and she has been checking pregnancy test.  She has had some vague abdominal discomfort since starting the antibiotics.   Observations/Objective:   Assessment and Plan: 1. Fever, unspecified fever cause Arranged Covid PCR test. Out of work until this returns. She is having no chest pain or shortness of breath.  Minimal cough.  2. Myalgia   3. Acute cystitis without hematuria Finish antibiotics.  4. Irregular menses Check pregnancy test.  May need to examine her if the abdominal pain persist.  I think it is due to the Augmentin.   Follow Up Instructions:    I discussed the assessment and treatment plan with the patient. The patient was provided an opportunity to ask  questions and all were answered. The patient agreed with the plan and demonstrated an understanding of the instructions.   The patient was advised to call back or seek an in-person evaluation if the symptoms worsen or if the condition fails to improve as anticipated.  I provided 12 minutes of non-face-to-face time during this encounter.   Riannah Stagner Wendelyn Breslow, MD    Virtual Visit via Telephone Note   This visit type was conducted due to national recommendations for restrictions regarding the COVID-19 Pandemic (e.g. social distancing) in an effort to limit this patient's exposure and mitigate transmission in our community. Due to her co-morbid illnesses, this patient is at least at moderate risk for complications without adequate follow up. This format is felt to be most appropriate for this patient at this time. The patient did not have access to video technology or had technical difficulties with video requiring transitioning to audio format only (telephone). Physical exam was limited to content and character of the telephone converstion.    Patient location: Home Provider location: Office   Visit Date: 06/13/2020  Today's healthcare provider: Megan Mans, MD   Chief Complaint  Patient presents with  . Follow-up   Subjective    HPI   Follow up UC visit  Patient was seen in UC for acute cystitis on 06/09/20. She was treated for acute cystitis, was given Augmentin 500-125 MG. She reports good compliance with treatment. She reports this condition is Worse.  -----------------------------------------------------------------------------------------  Patient complaining of fever off and on, Congestion, abdominal pain, fatigue, cough and  runny nose and she said they started around last Sunday.   Patient says that she can not tell if her uti has improved, she said that she was having painful urination and that has went away.   Patient has tried Mucinex and Advil for cough  symptoms.     Social History   Tobacco Use  . Smoking status: Current Some Day Smoker  . Smokeless tobacco: Never Used  Substance Use Topics  . Alcohol use: Yes    Comment: 1 beer at dinner usually  . Drug use: No      Medications: Outpatient Medications Prior to Visit  Medication Sig  . citalopram (CELEXA) 10 MG tablet Take 1 tablet (10 mg total) by mouth daily. (Patient not taking: Reported on 04/09/2018)  . Desvenlafaxine ER (PRISTIQ) 50 MG TB24 Take 1 tablet (50 mg total) by mouth daily.  . propranolol (INDERAL) 20 MG tablet Take 1 tablet (20 mg total) by mouth every morning.  . SPRINTEC 28 0.25-35 MG-MCG tablet Take 1 tablet by mouth daily.  . SUMAtriptan (IMITREX) 50 MG tablet Take 1 tablet (50 mg total) by mouth every 2 (two) hours as needed for migraine. May repeat in 2 hours if headache persists or recurs.   No facility-administered medications prior to visit.    Review of Systems     Objective    There were no vitals taken for this visit.      Assessment & Plan       No follow-ups on file.    I discussed the assessment and treatment plan with the patient. The patient was provided an opportunity to ask questions and all were answered. The patient agreed with the plan and demonstrated an understanding of the instructions.   The patient was advised to call back or seek an in-person evaluation if the symptoms worsen or if the condition fails to improve as anticipated.  I provided 12 minutes of non-face-to-face time during this encounter.    Inari Shin Wendelyn Breslow, MD [facility] [phone] (phone) [phone] (fax)  [facility]

## 2020-06-14 NOTE — Telephone Encounter (Signed)
Dr. Sullivan Lone is going to take work note to patient.

## 2020-06-16 ENCOUNTER — Encounter: Payer: Self-pay | Admitting: Family Medicine

## 2020-06-16 ENCOUNTER — Telehealth (INDEPENDENT_AMBULATORY_CARE_PROVIDER_SITE_OTHER): Payer: Medicaid Other | Admitting: Family Medicine

## 2020-06-16 ENCOUNTER — Telehealth: Payer: Self-pay

## 2020-06-16 DIAGNOSIS — N926 Irregular menstruation, unspecified: Secondary | ICD-10-CM | POA: Insufficient documentation

## 2020-06-16 DIAGNOSIS — R509 Fever, unspecified: Secondary | ICD-10-CM

## 2020-06-16 DIAGNOSIS — T148XXA Other injury of unspecified body region, initial encounter: Secondary | ICD-10-CM

## 2020-06-16 DIAGNOSIS — R5383 Other fatigue: Secondary | ICD-10-CM

## 2020-06-16 DIAGNOSIS — B001 Herpesviral vesicular dermatitis: Secondary | ICD-10-CM | POA: Insufficient documentation

## 2020-06-16 DIAGNOSIS — G40909 Epilepsy, unspecified, not intractable, without status epilepticus: Secondary | ICD-10-CM

## 2020-06-16 NOTE — Telephone Encounter (Signed)
Copied from CRM [id number]. Topic: General - Other >> Jun 16, 2020  3:58 PM Taylor Esparza wrote: Reason for CRM: Patient called to inquire of Dr Sullivan Lone she was scheduled for Esparza virtual visit at 3.20 PM she logged into her my chart and waited 30 minutes then called the office no one picked up. Patient stated that she took the afternoon off from work because she felt terrible and needed to see Dr Sullivan Lone she would like Esparza call back please at Ph# [phone]

## 2020-06-16 NOTE — Progress Notes (Signed)
MyChart Video Visit Virtual Visit via Video Note  I connected with Taylor Esparza on 06/17/20 at  3:20 PM EDT by a video enabled telemedicine application and verified that I am speaking with the correct person using two identifiers.  Location: Patient: Home Provider: Office   I discussed the limitations of evaluation and management by telemedicine and the availability of in person appointments. The patient expressed understanding and agreed to proceed.  History of Present Illness: Since the first of the month this patient has not felt well.  It is of note that she has had seizures in the past year and this has been difficult on her and she has not felt well due to the seizures and due to the medications.  She is now on Keppra.  She has a appointment soon with neurology. Since the first of the month she has had some low-grade fevers and not felt well.  Just got back in a negative Covid test.  She has had 2 - pregnancy test at home.  She was seen at the urgent care center on 8 3 and treated for cystitis. The last few days she has had no fever but feels dizzy and it feels as though she could be having seizures during this time.  She also is very fatigued and has easy bruisability.  She is wondering if she is having seizures during her sleep and is hitting things from this. Observations/Objective:   Assessment and Plan: 1. Fatigue, unspecified type Covid PCR test is negative.  Obtain lab work.  No further fever for presently. - EBV ab to viral capsid ag pnl, IgG+IgM - CMV abs, IgG+IgM (cytomegalovirus) - CBC w/Diff/Platelet - Comprehensive Metabolic Panel (CMET) - TSH  2. Fever, unspecified fever cause Resolved over the last 2 days - EBV ab to viral capsid ag pnl, IgG+IgM - CMV abs, IgG+IgM (cytomegalovirus) - CBC w/Diff/Platelet - Comprehensive Metabolic Panel (CMET) - TSH  3. Bruising She shows me bruising on her arms and her hands.  Obtain blood data.  May need to see  her in the office next week. - EBV ab to viral capsid ag pnl, IgG+IgM - CMV abs, IgG+IgM (cytomegalovirus) - CBC w/Diff/Platelet - Comprehensive Metabolic Panel (CMET) - TSH 4.  Seizure disorder She has appointment next week with neurology.  Follow Up Instructions:    I discussed the assessment and treatment plan with the patient. The patient was provided an opportunity to ask questions and all were answered. The patient agreed with the plan and demonstrated an understanding of the instructions.   The patient was advised to call back or seek an in-person evaluation if the symptoms worsen or if the condition fails to improve as anticipated.  I provided 12 minutes of non-face-to-face time during this encounter.   Deeric Cruise Wendelyn Breslow, MD    Virtual Visit via Video Note   This visit type was conducted due to national recommendations for restrictions regarding the COVID-19 Pandemic (e.g. social distancing) in an effort to limit this patient's exposure and mitigate transmission in our community. This patient is at least at moderate risk for complications without adequate follow up. This format is felt to be most appropriate for this patient at this time. Physical exam was limited by quality of the video and audio technology used for the visit.   Patient location:  Provider location:    Patient: Taylor Esparza   DOB: [birth year]   24 y.o. Female  MRN: [id number] Visit Date: 06/16/2020  Today's  healthcare provider: Megan Mans, MD   Chief Complaint  Patient presents with  . URI   Subjective    URI  This is a new problem. Episode onset: 2 weeks ago. The problem has been unchanged. The maximum temperature recorded prior to her arrival was 103 - 104 F. The fever has been present for 3 to 4 days. Associated symptoms include abdominal pain, congestion, coughing and headaches. Associated symptoms comments: SOB and fatigue. Treatments tried: Advil, Amoxicillin. The treatment  provided mild relief.         Medications: Outpatient Medications Prior to Visit  Medication Sig  . citalopram (CELEXA) 10 MG tablet Take 1 tablet (10 mg total) by mouth daily. (Patient not taking: Reported on 04/09/2018)  . Desvenlafaxine ER (PRISTIQ) 50 MG TB24 Take 1 tablet (50 mg total) by mouth daily. (Patient not taking: Reported on 06/13/2020)  . levETIRAcetam (KEPPRA) 500 MG tablet Take by mouth.  . propranolol (INDERAL) 20 MG tablet Take 1 tablet (20 mg total) by mouth every morning. (Patient not taking: Reported on 06/13/2020)  . SPRINTEC 28 0.25-35 MG-MCG tablet Take 1 tablet by mouth daily. (Patient not taking: Reported on 06/13/2020)  . SUMAtriptan (IMITREX) 50 MG tablet Take 1 tablet (50 mg total) by mouth every 2 (two) hours as needed for migraine. May repeat in 2 hours if headache persists or recurs. (Patient not taking: Reported on 06/13/2020)   No facility-administered medications prior to visit.    Review of Systems  Constitutional: Negative.   HENT: Positive for congestion.   Respiratory: Positive for cough.   Cardiovascular: Negative.   Gastrointestinal: Positive for abdominal pain.  Musculoskeletal: Negative.   Neurological: Positive for headaches.       Objective    There were no vitals taken for this visit.    Physical Exam     Assessment & Plan       No follow-ups on file.     I discussed the assessment and treatment plan with the patient. The patient was provided an opportunity to ask questions and all were answered. The patient agreed with the plan and demonstrated an understanding of the instructions.   The patient was advised to call back or seek an in-person evaluation if the symptoms worsen or if the condition fails to improve as anticipated.  I provided 12 minutes of non-face-to-face time during this encounter.    Damichael Hofman Wendelyn Breslow, MD [facility] [phone] (phone) [phone] (fax)  [facility]

## 2020-06-18 LAB — COMPREHENSIVE METABOLIC PANEL
ALT: 59 IU/L — ABNORMAL HIGH (ref 0–32)
AST: 130 IU/L — ABNORMAL HIGH (ref 0–40)
Albumin/Globulin Ratio: 1.4 (ref 1.2–2.2)
Albumin: 4.1 g/dL (ref 3.9–5.0)
Alkaline Phosphatase: 110 IU/L (ref 48–121)
BUN/Creatinine Ratio: 5 — ABNORMAL LOW (ref 9–23)
BUN: 3 mg/dL — ABNORMAL LOW (ref 6–20)
Bilirubin Total: 0.9 mg/dL (ref 0.0–1.2)
CO2: 21 mmol/L (ref 20–29)
Calcium: 8.7 mg/dL (ref 8.7–10.2)
Chloride: 100 mmol/L (ref 96–106)
Creatinine, Ser: 0.65 mg/dL (ref 0.57–1.00)
GFR calc Af Amer: 142 mL/min/{1.73_m2} (ref >59)
GFR calc non Af Amer: 123 mL/min/{1.73_m2} (ref >59)
Globulin, Total: 3 g/dL (ref 1.5–4.5)
Glucose: 93 mg/dL (ref 65–99)
Potassium: 3.7 mmol/L (ref 3.5–5.2)
Sodium: 136 mmol/L (ref 134–144)
Total Protein: 7.1 g/dL (ref 6.0–8.5)

## 2020-06-18 LAB — CBC WITH DIFFERENTIAL/PLATELET
Basophils Absolute: 0.1 10*3/uL (ref 0.0–0.2)
Basos: 2 %
EOS (ABSOLUTE): 0.2 10*3/uL (ref 0.0–0.4)
Eos: 4 %
Hematocrit: 40.2 % (ref 34.0–46.6)
Hemoglobin: 13.7 g/dL (ref 11.1–15.9)
Immature Grans (Abs): 0 10*3/uL (ref 0.0–0.1)
Immature Granulocytes: 0 %
Lymphocytes Absolute: 1.6 10*3/uL (ref 0.7–3.1)
Lymphs: 23 %
MCH: 33.9 pg — ABNORMAL HIGH (ref 26.6–33.0)
MCHC: 34.1 g/dL (ref 31.5–35.7)
MCV: 100 fL — ABNORMAL HIGH (ref 79–97)
Monocytes Absolute: 0.5 10*3/uL (ref 0.1–0.9)
Monocytes: 7 %
Neutrophils Absolute: 4.4 10*3/uL (ref 1.4–7.0)
Neutrophils: 64 %
Platelets: 220 10*3/uL (ref 150–450)
RBC: 4.04 x10E6/uL (ref 3.77–5.28)
RDW: 11.7 % (ref 11.7–15.4)
WBC: 6.8 10*3/uL (ref 3.4–10.8)

## 2020-06-18 LAB — EBV AB TO VIRAL CAPSID AG PNL, IGG+IGM
EBV VCA IgG: 350 U/mL — ABNORMAL HIGH (ref 0.0–17.9)
EBV VCA IgM: <36 U/mL (ref 0.0–35.9)

## 2020-06-18 LAB — CMV ABS, IGG+IGM (CYTOMEGALOVIRUS)
CMV Ab - IgG: <0.6 U/mL (ref 0.00–0.59)
CMV IgM Ser EIA-aCnc: <30 AU/mL (ref 0.0–29.9)

## 2020-06-18 LAB — TSH: TSH: 1.91 u[IU]/mL (ref 0.450–4.500)

## 2020-06-20 ENCOUNTER — Telehealth: Payer: Self-pay | Admitting: Family Medicine

## 2020-06-30 ENCOUNTER — Telehealth: Payer: Self-pay

## 2020-06-30 NOTE — Telephone Encounter (Signed)
-----   Message from Maple Hudson., MD sent at 06/30/2020 12:15 PM EDT ----- Labs okay except for elevated liver function test.  Old mononucleosis is noted.  Would recommend cutting back to no more than 1 drink of alcohol per day.

## 2020-06-30 NOTE — Telephone Encounter (Signed)
Unable to reach patient at this time voicemail box is full will try reaching out to patient at a later time, if patient returns office call okay for [facility] triage to advise patient. KW

## 2020-07-01 NOTE — Telephone Encounter (Signed)
Patient called, voicemail picked up but hung up before message could be left.

## 2020-07-04 NOTE — Telephone Encounter (Signed)
Pt. Given results, verbalizes understanding. 

## 2020-09-27 ENCOUNTER — Telehealth (INDEPENDENT_AMBULATORY_CARE_PROVIDER_SITE_OTHER): Payer: Self-pay | Admitting: Physician Assistant

## 2020-09-27 DIAGNOSIS — J40 Bronchitis, not specified as acute or chronic: Secondary | ICD-10-CM

## 2020-09-27 MED ORDER — ALBUTEROL SULFATE HFA 108 (90 BASE) MCG/ACT IN AERS: 2.0000 | INHALATION_SPRAY | Freq: Four times a day (QID) | RESPIRATORY_TRACT | 2 refills | Status: AC | PRN

## 2020-09-27 MED ORDER — PREDNISONE 20 MG PO TABS: 20.0000 mg | ORAL_TABLET | Freq: Every day | ORAL | 0 refills | Status: AC

## 2020-09-27 MED ORDER — PROMETHAZINE-DM 6.25-15 MG/5ML PO SYRP: 5.0000 mL | ORAL_SOLUTION | Freq: Every evening | ORAL | 0 refills | Status: AC | PRN

## 2020-09-27 NOTE — Progress Notes (Signed)
MyChart Video Visit    Virtual Visit via Video Note   This visit type was conducted due to national recommendations for restrictions regarding the COVID-19 Pandemic (e.g. social distancing) in an effort to limit this patient's exposure and mitigate transmission in our community. This patient is at least at moderate risk for complications without adequate follow up. This format is felt to be most appropriate for this patient at this time. Physical exam was limited by quality of the video and audio technology used for the visit.   Patient location: Home Provider location: Office   I discussed the limitations of evaluation and management by telemedicine and the availability of in person appointments. The patient expressed understanding and agreed to proceed.  Patient: Taylor Esparza   DOB: 1996/01/08   24 y.o. Female  MRN: [id number] Visit Date: 09/27/2020  Today's healthcare provider: Trey Sailors, PA-C   Chief Complaint  Patient presents with  . URI  I,Porsha C McClurkin,acting as a scribe for [employer], PA-C.,have documented all relevant documentation on the behalf of Trey Sailors, PA-C,as directed by  Trey Sailors, PA-C while in the presence of Trey Sailors, PA-C.  Subjective    URI  This is a new problem. The current episode started in the past 7 days. The problem has been unchanged. Associated symptoms include congestion, coughing, diarrhea, headaches, rhinorrhea, sinus pain, a sore throat and wheezing. Pertinent negatives include no ear pain. She has tried decongestant and antihistamine for the symptoms. The treatment provided mild relief.  Patient states that she took a home covid test and it was negative. She reports this past Friday she started having symptoms. She reports headaches, congestion, diarrhea, fatigue, and muscle pain. She reports taking an at home COVID test which was negative. She is reporting wheezing.      Medications: Outpatient  Medications Prior to Visit  Medication Sig  . citalopram (CELEXA) 10 MG tablet Take 1 tablet (10 mg total) by mouth daily. (Patient not taking: Reported on 04/09/2018)  . Desvenlafaxine ER (PRISTIQ) 50 MG TB24 Take 1 tablet (50 mg total) by mouth daily. (Patient not taking: Reported on 06/13/2020)  . levETIRAcetam (KEPPRA) 500 MG tablet Take by mouth.  . propranolol (INDERAL) 20 MG tablet Take 1 tablet (20 mg total) by mouth every morning. (Patient not taking: Reported on 06/13/2020)  . SPRINTEC 28 0.25-35 MG-MCG tablet Take 1 tablet by mouth daily. (Patient not taking: Reported on 06/13/2020)  . SUMAtriptan (IMITREX) 50 MG tablet Take 1 tablet (50 mg total) by mouth every 2 (two) hours as needed for migraine. May repeat in 2 hours if headache persists or recurs. (Patient not taking: Reported on 06/13/2020)   No facility-administered medications prior to visit.    Review of Systems  Constitutional: Positive for fatigue and fever. Negative for appetite change and chills.  HENT: Positive for congestion, postnasal drip, rhinorrhea, sinus pressure, sinus pain and sore throat. Negative for ear pain.   Respiratory: Positive for cough, chest tightness, shortness of breath and wheezing.   Gastrointestinal: Positive for diarrhea.  Neurological: Positive for weakness and headaches.      Objective    There were no vitals taken for this visit.   Physical Exam Constitutional:      Appearance: Normal appearance.  Pulmonary:     Effort: Pulmonary effort is normal. No respiratory distress.  Neurological:     Mental Status: She is alert.  Psychiatric:  Mood and Affect: Mood normal.        Behavior: Behavior normal.        Assessment & Plan    1. Bronchitis  - COVID-19, Flu A+B and RSV - promethazine-dextromethorphan (PROMETHAZINE-DM) 6.25-15 MG/5ML syrup; Take 5 mLs by mouth at bedtime as needed.  Dispense: 118 mL; Refill: 0 - albuterol (VENTOLIN HFA) 108 (90 Base) MCG/ACT inhaler; Inhale 2  puffs into the lungs every 6 (six) hours as needed for wheezing or shortness of breath.  Dispense: 1 each; Refill: 2 - predniSONE (DELTASONE) 20 MG tablet; Take 1 tablet (20 mg total) by mouth daily with breakfast for 5 days.  Dispense: 5 tablet; Refill: 0   No follow-ups on file.     I discussed the assessment and treatment plan with the patient. The patient was provided an opportunity to ask questions and all were answered. The patient agreed with the plan and demonstrated an understanding of the instructions.   The patient was advised to call back or seek an in-person evaluation if the symptoms worsen or if the condition fails to improve as anticipated.   ITrey Sailors, PA-C, have reviewed all documentation for this visit. The documentation on 09/27/20 for the exam, diagnosis, procedures, and orders are all accurate and complete.  The entirety of the information documented in the History of Present Illness, Review of Systems and Physical Exam were personally obtained by me. Portions of this information were initially documented by [facility] and reviewed by me for thoroughness and accuracy.    Maryella Shivers [facility] [phone] (phone) [phone] (fax)  [facility]

## 2021-01-20 ENCOUNTER — Telehealth: Payer: Self-pay | Admitting: *Deleted

## 2021-01-20 NOTE — Telephone Encounter (Signed)
Copied from CRM [id number]. Topic: General - Other >> Jan 20, 2021 12:01 PM Randol Kern wrote: Reason for CRM: Pt called requesting a work note that explains that she has seizures and migraines, her condition has been sporadic. She has missed today 01/20/2021. Needs documentation that states that her PCP is confirming that she has these conditions in the case that she needs to call out. Please advise and upload via mychart  Please contact for any questions or if she needs this from another provider.  Best contact: [phone]

## 2021-01-20 NOTE — Telephone Encounter (Signed)
Please advise 

## 2021-01-24 NOTE — Telephone Encounter (Signed)
Okay to write her note to this effect.  It may be what she needs is FMLA papers for this.  Thank you for your help

## 2021-01-26 NOTE — Telephone Encounter (Addendum)
Tried calling pt, no answer and no vm

## 2021-03-08 ENCOUNTER — Ambulatory Visit: Payer: Medicaid Other | Admitting: Family Medicine

## 2021-03-08 NOTE — Progress Notes (Deleted)
      Established patient visit   Patient: Taylor Esparza   DOB: December 18, 1995   25 y.o. Female  MRN: [id number] Visit Date: 03/08/2021  Today's healthcare provider: Megan Mans, MD   No chief complaint on file.  Subjective    HPI  Anxiety, Follow-up  She was last seen for anxiety 04/09/2018. Changes made at last visit include; on propranolol.   She reports {excellent/good/fair/poor:19665} compliance with treatment. She reports {good/fair/poor:18685} tolerance of treatment. She {is/is not:21021397} having side effects. {document side effects if present:1}  She feels her anxiety is {Desc; severity:60313} and {improved/worse/unchanged:3041574} since last visit.  Symptoms: {Yes/No:20286} chest pain {Yes/No:20286} difficulty concentrating  {Yes/No:20286} dizziness {Yes/No:20286} fatigue  {Yes/No:20286} feelings of losing control {Yes/No:20286} insomnia  {Yes/No:20286} irritable {Yes/No:20286} palpitations  {Yes/No:20286} panic attacks {Yes/No:20286} racing thoughts  {Yes/No:20286} shortness of breath {Yes/No:20286} sweating  {Yes/No:20286} tremors/shakes    GAD-7 Results No flowsheet data found.  PHQ-9 Scores PHQ9 SCORE ONLY 11/20/2016  PHQ-9 Total Score 9    ---------------------------------------------------------------------------------------------------   {Show patient history (optional):23778::" "}   Medications: Outpatient Medications Prior to Visit  Medication Sig  . albuterol (VENTOLIN HFA) 108 (90 Base) MCG/ACT inhaler Inhale 2 puffs into the lungs every 6 (six) hours as needed for wheezing or shortness of breath.  . citalopram (CELEXA) 10 MG tablet Take 1 tablet (10 mg total) by mouth daily. (Patient not taking: Reported on 04/09/2018)  . Desvenlafaxine ER (PRISTIQ) 50 MG TB24 Take 1 tablet (50 mg total) by mouth daily. (Patient not taking: Reported on 09/27/2020)  . levETIRAcetam (KEPPRA) 500 MG tablet Take by mouth.  . promethazine-dextromethorphan  (PROMETHAZINE-DM) 6.25-15 MG/5ML syrup Take 5 mLs by mouth at bedtime as needed.  . propranolol (INDERAL) 20 MG tablet Take 1 tablet (20 mg total) by mouth every morning. (Patient not taking: Reported on 06/13/2020)  . SPRINTEC 28 0.25-35 MG-MCG tablet Take 1 tablet by mouth daily. (Patient not taking: Reported on 06/13/2020)  . SUMAtriptan (IMITREX) 50 MG tablet Take 1 tablet (50 mg total) by mouth every 2 (two) hours as needed for migraine. May repeat in 2 hours if headache persists or recurs. (Patient not taking: Reported on 06/13/2020)   No facility-administered medications prior to visit.    Review of Systems  Constitutional: Negative for appetite change, chills, fatigue and fever.  Respiratory: Negative for chest tightness and shortness of breath.   Cardiovascular: Negative for chest pain and palpitations.  Gastrointestinal: Negative for abdominal pain, nausea and vomiting.  Neurological: Negative for dizziness and weakness.    {Labs  Heme  Chem  Endocrine  Serology  Results Review (optional):23779::" "}   Objective    There were no vitals taken for this visit. {Show previous vital signs (optional):23777::" "}   Physical Exam  ***  No results found for any visits on 03/08/21.  Assessment & Plan     ***  No follow-ups on file.      {provider attestation***:1}   Megan Mans, MD  [facility] [phone] (phone) [phone] (fax)  [facility]

## 2021-09-08 IMAGING — MR MR HEAD W/O CM
13 series · 45 of 48 positions shown · non-contrast
Comparison: None.

CLINICAL DATA: Seizure

EXAM:
MRI HEAD WITHOUT CONTRAST
TECHNIQUE: Multiplanar, multiecho pulse sequences of the brain and surrounding
structures were obtained without intravenous contrast.

[Series 5: T1 · sagittal · 5.0mm · 0.62mm/px · 2 of 21 slices shown (1 of 2)]
[im 1/21]
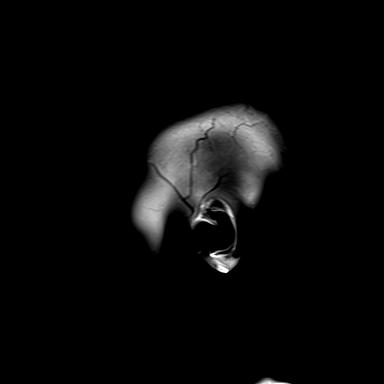
[im 21/21]
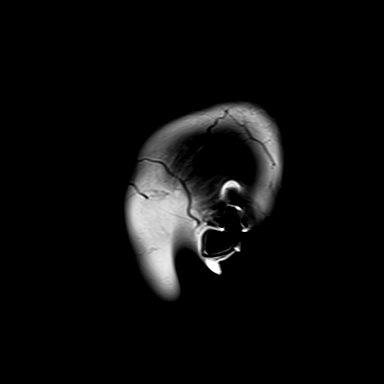

[Series 6: ax dwi_tracew · axial · 3.0mm · 0.60mm/px · z∈[-79,+75]mm · 3 of 48 slices shown]
[im 1/48]
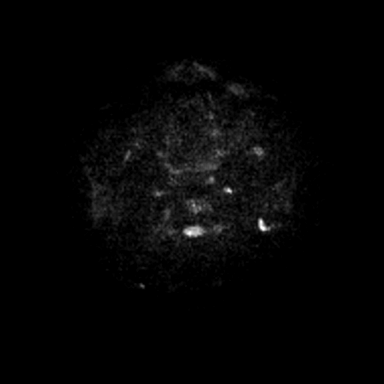
[im 24/48]
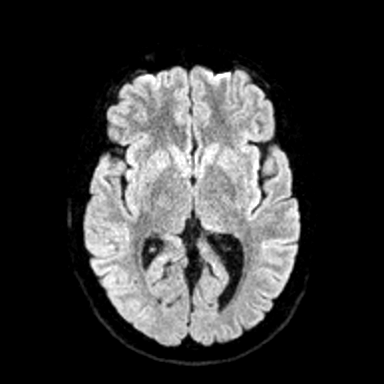
[im 48/48]
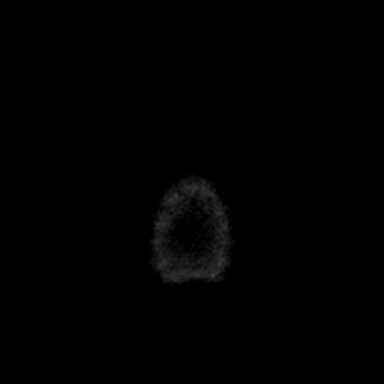

[Series 7: ax dwi_adc · axial · 3.0mm · 0.60mm/px · z∈[-79,+75]mm · 3 of 48 slices shown]
[im 1/48]
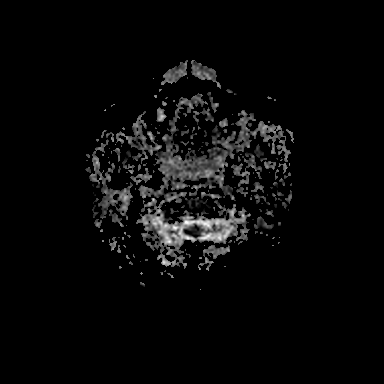
[im 24/48]
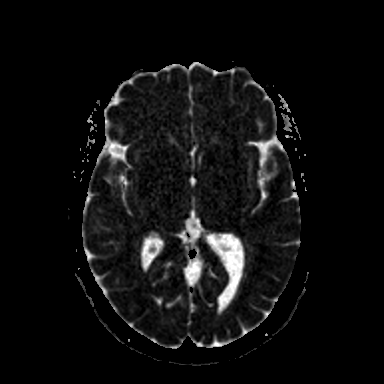
[im 48/48]
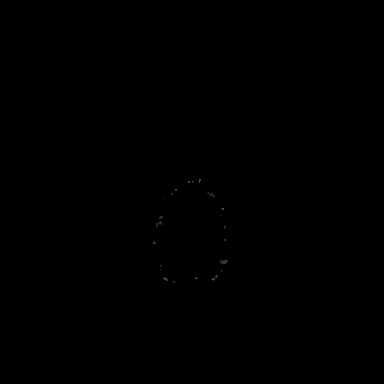

[Series 8: cor dwi_tracew · coronal · 5.0mm · 0.60mm/px · 3 of 40 slices shown]
[im 1/40]
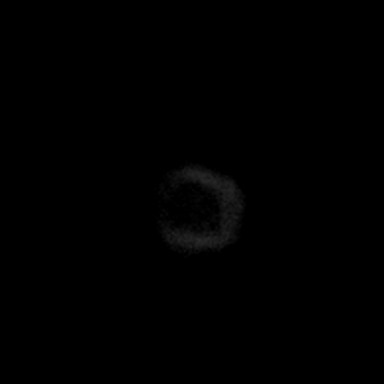
[im 20/40]
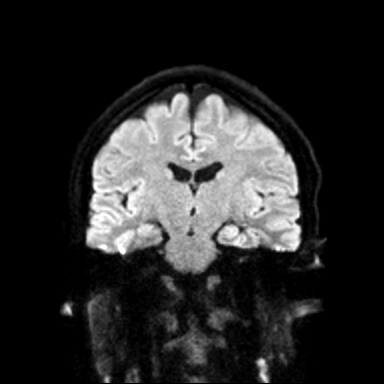
[im 40/40]
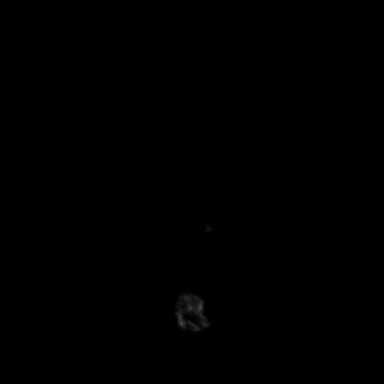

[Series 9: cor dwi_adc · coronal · 5.0mm · 0.60mm/px · 3 of 38 slices shown]
[im 1/38]
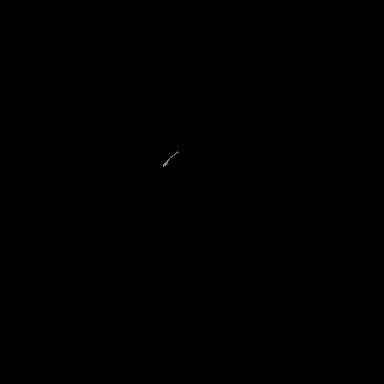
[im 19/38]
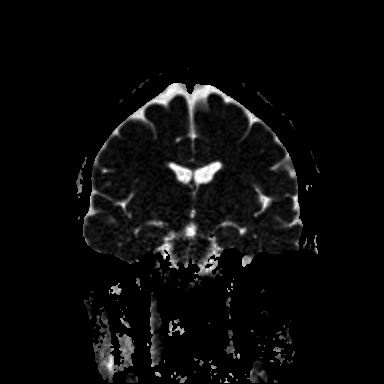
[im 38/38]
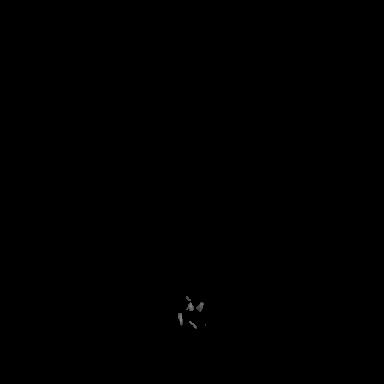

[Series 10: T2 · axial · 5.0mm · 0.53mm/px · z∈[-72,+72]mm · 2 of 25 slices shown (1 of 2)]
[im 1/25]
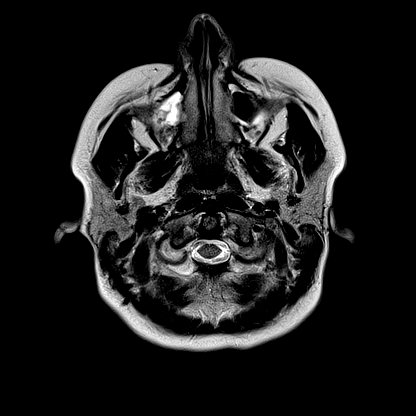
[im 25/25]
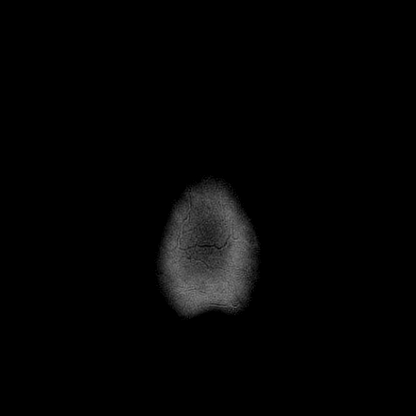

[Series 11: mag_images · axial · 3.0mm · 0.90mm/px · z∈[-88,+88]mm · 4 of 60 slices shown]
[im 1/60]
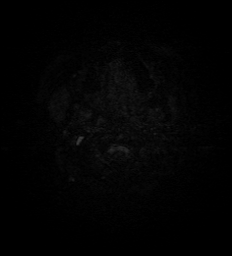
[im 20/60]
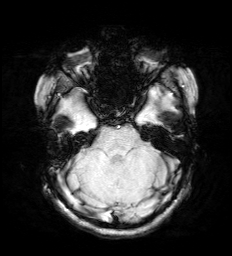
[im 40/60]
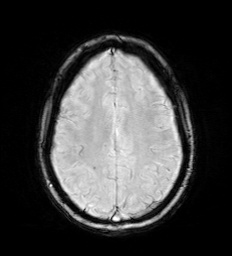
[im 60/60]
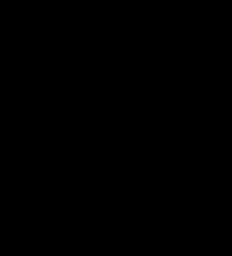

[Series 12: pha_images · axial · 3.0mm · 0.90mm/px · z∈[-88,+82]mm · 4 of 58 slices shown]
[im 1/58]
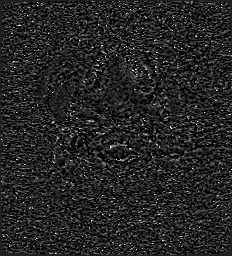
[im 20/58]
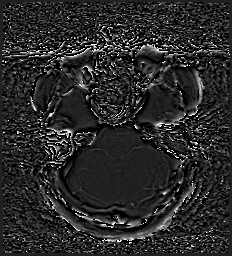
[im 39/58]
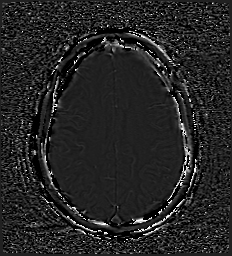
[im 58/58]
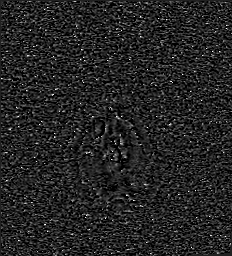

[Series 13: swi_images · axial · 3.0mm · 0.90mm/px · z∈[-88,+88]mm · 4 of 60 slices shown]
[im 1/60]
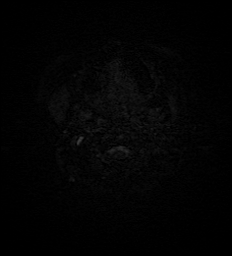
[im 20/60]
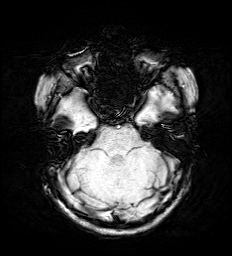
[im 40/60]
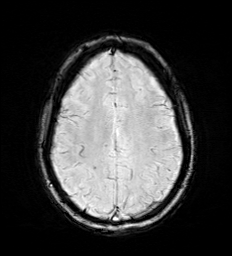
[im 60/60]
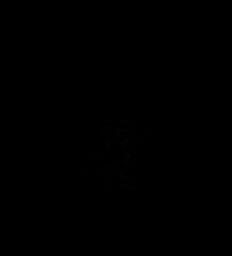

[Series 15: FLAIR · axial · 3.0mm · 0.53mm/px · z∈[-83,+79]mm · 4 of 55 slices shown (1 of 2)]
[im 1/55]
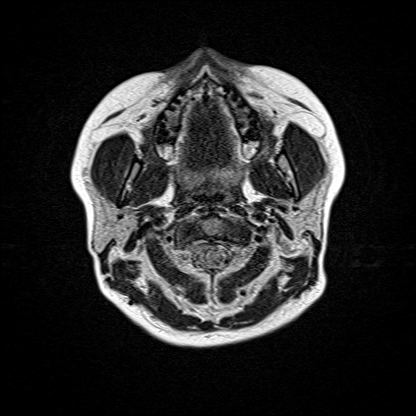
[im 19/55]
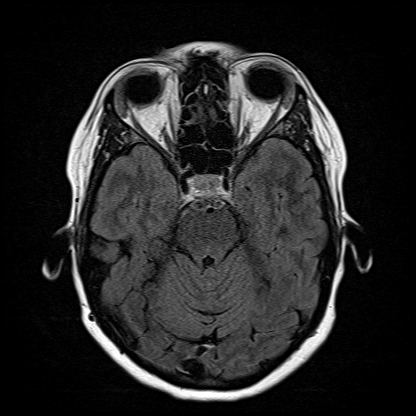
[im 37/55]
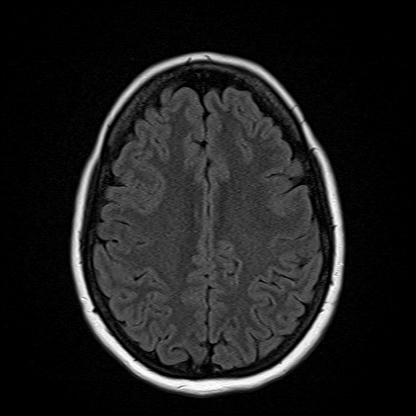
[im 55/55]
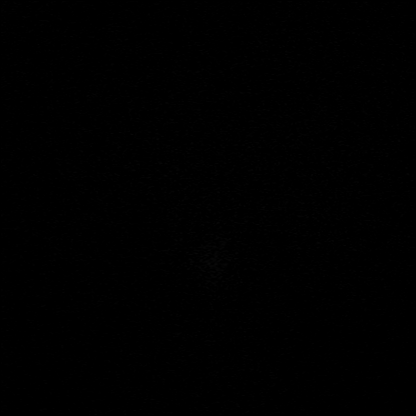

[Series 16: T1 · axial · 1.0mm · 0.98mm/px · z∈[-84,+91]mm · 9 of 173 slices shown (2 of 2)]
[im 1/173]
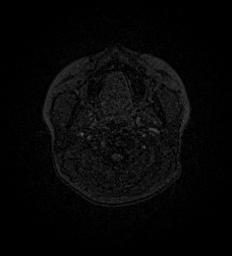
[im 16/173]
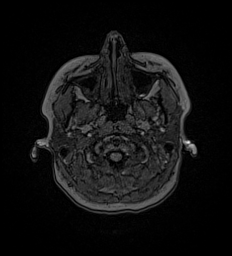
[im 32/173]
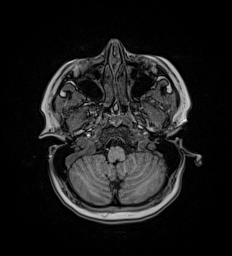
[im 47/173]
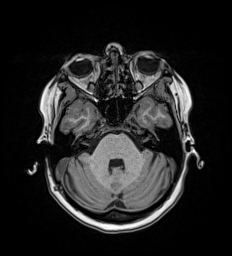
[im 79/173]
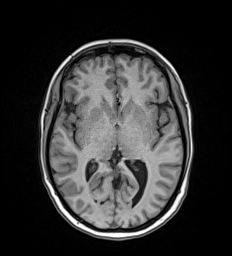
[im 94/173]
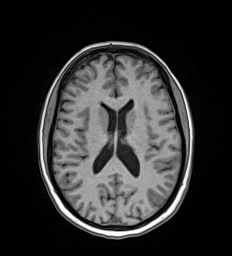
[im 126/173]
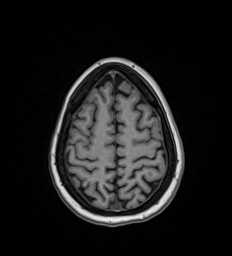
[im 141/173]
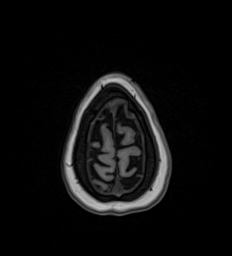
[im 173/173]
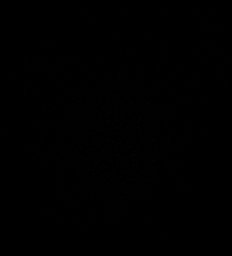

[Series 17: T2 · coronal · 3.0mm · 0.47mm/px · 2 of 35 slices shown (2 of 2)]
[im 1/35]
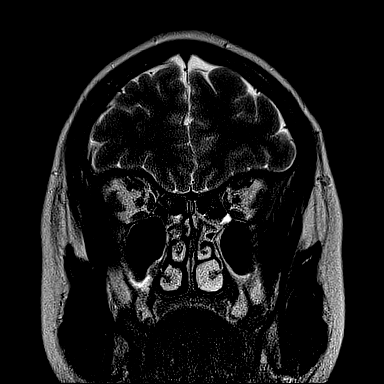
[im 35/35]
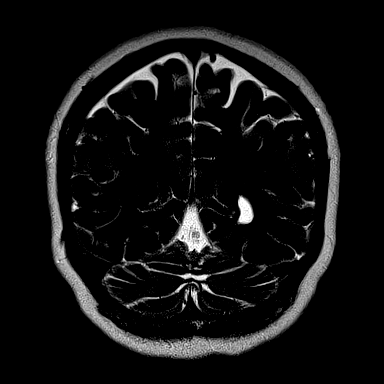

[Series 18: FLAIR · coronal · 3.0mm · 0.43mm/px · 2 of 35 slices shown (2 of 2)]
[im 1/35]
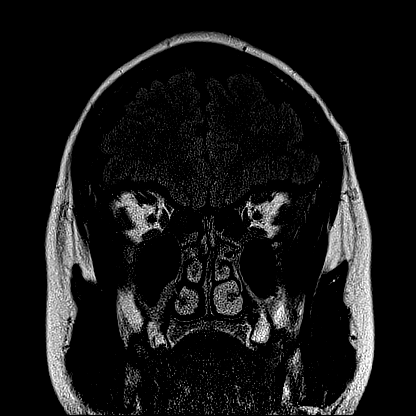
[im 35/35]
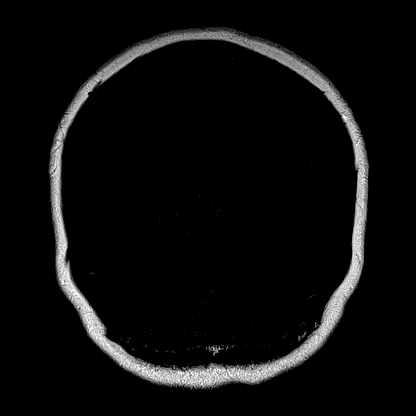

[45 of 48 positions shown; findings below may reference images not displayed]

FINDINGS: Brain: No acute infarction, hemorrhage, hydrocephalus, extra-axial
collection or mass lesion.

Vascular: Normal arterial flow voids.

Skull and upper cervical spine: Negative

Sinuses/Orbits: Moderate mucosal edema paranasal sinuses. Normal
orbit

Other: None
IMPRESSION: Negative MRI brain

Sinus mucosal disease.

## 2022-03-25 ENCOUNTER — Other Ambulatory Visit: Payer: Self-pay | Admitting: Family Medicine

## 2022-03-25 MED ORDER — LEVETIRACETAM 750 MG PO TABS: 750.0000 mg | ORAL_TABLET | Freq: Two times a day (BID) | ORAL | 1 refills | Status: AC

## 2022-12-02 ENCOUNTER — Other Ambulatory Visit: Payer: Self-pay

## 2022-12-02 ENCOUNTER — Emergency Department (HOSPITAL_COMMUNITY)
Admission: EM | Admit: 2022-12-02 | Discharge: 2022-12-03 | Disposition: A | Discharge status: Home / Self Care | Payer: Medicaid Other | Attending: Emergency Medicine | Admitting: Emergency Medicine

## 2022-12-02 ENCOUNTER — Encounter (HOSPITAL_COMMUNITY): Payer: Self-pay | Admitting: Emergency Medicine

## 2022-12-02 DIAGNOSIS — T7621XA Adult sexual abuse, suspected, initial encounter: Secondary | ICD-10-CM | POA: Insufficient documentation

## 2022-12-02 DIAGNOSIS — T7421XA Adult sexual abuse, confirmed, initial encounter: Secondary | ICD-10-CM

## 2022-12-02 HISTORY — DX: Migraine, unspecified, not intractable, without status migrainosus: G43.909

## 2022-12-02 HISTORY — DX: Epilepsy, unspecified, not intractable, without status epilepticus: G40.909
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
## 2022-12-03 NOTE — SANE Note (Signed)
The SANE/FNE Naval architect) consult has been completed. The primary RN and/or provider have been notified. Please contact the SANE/FNE nurse on call (listed in [place]) with any further concerns.  Silverio Decamp, PA-C aware of patient's condition and treatment choices.
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
## 2023-03-20 ENCOUNTER — Emergency Department (HOSPITAL_COMMUNITY)
Admission: EM | Admit: 2023-03-20 | Discharge: 2023-03-20 | Disposition: A | Discharge status: Home / Self Care | Payer: Medicaid Other | Attending: Emergency Medicine | Admitting: Emergency Medicine

## 2023-03-20 ENCOUNTER — Other Ambulatory Visit: Payer: Self-pay

## 2023-03-20 DIAGNOSIS — F17200 Nicotine dependence, unspecified, uncomplicated: Secondary | ICD-10-CM | POA: Insufficient documentation

## 2023-03-20 DIAGNOSIS — Y908 Blood alcohol level of 240 mg/100 ml or more: Secondary | ICD-10-CM | POA: Diagnosis not present

## 2023-03-20 DIAGNOSIS — F10920 Alcohol use, unspecified with intoxication, uncomplicated: Secondary | ICD-10-CM | POA: Diagnosis present

## 2023-03-20 LAB — ETHANOL: Alcohol, Ethyl (B): 375 mg/dL (ref <10)

## 2023-03-20 MED ORDER — ONDANSETRON HCL 4 MG/2ML IJ SOLN
4.0000 mg | Freq: Once | INTRAMUSCULAR | Status: AC
  Administered 2023-03-20: 4 mg via INTRAVENOUS
  Filled 2023-03-20: qty 2

## 2023-03-20 MED ORDER — PANTOPRAZOLE SODIUM 40 MG IV SOLR
40.0000 mg | Freq: Once | INTRAVENOUS | Status: AC
  Administered 2023-03-20: 40 mg via INTRAVENOUS
  Filled 2023-03-20: qty 10

## 2023-03-20 MED ORDER — SODIUM CHLORIDE 0.9 % IV BOLUS
1000.0000 mL | Freq: Once | INTRAVENOUS | Status: AC
  Administered 2023-03-20: 1000 mL via INTRAVENOUS
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
## 2023-03-20 NOTE — ED Notes (Signed)
As soon as [employer] left patient became rude. Called nurse a "bitc*".
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
## 2024-10-18 ENCOUNTER — Other Ambulatory Visit: Payer: Self-pay | Admitting: Medical Genetics

## 2024-10-18 ENCOUNTER — Ambulatory Visit (HOSPITAL_COMMUNITY): Payer: Self-pay
